# Patient Record
Sex: Female | Born: 1980 | Race: White | Hispanic: No | Smoking: Never smoker
Health system: Southern US, Community
[De-identification: ages and names within clinical notes are randomized; demographics above are authoritative.]

## PROBLEM LIST (undated history)

## (undated) DIAGNOSIS — I10 Essential (primary) hypertension: Secondary | ICD-10-CM

## (undated) DIAGNOSIS — E7800 Pure hypercholesterolemia, unspecified: Secondary | ICD-10-CM

## (undated) DIAGNOSIS — G43909 Migraine, unspecified, not intractable, without status migrainosus: Secondary | ICD-10-CM

## (undated) DIAGNOSIS — N83209 Unspecified ovarian cyst, unspecified side: Secondary | ICD-10-CM

## (undated) DIAGNOSIS — D649 Anemia, unspecified: Secondary | ICD-10-CM

## (undated) DIAGNOSIS — J45909 Unspecified asthma, uncomplicated: Secondary | ICD-10-CM

## (undated) DIAGNOSIS — J40 Bronchitis, not specified as acute or chronic: Secondary | ICD-10-CM

## (undated) HISTORY — PX: TUBAL LIGATION: SHX77

## (undated) HISTORY — PX: CHOLECYSTECTOMY: SHX55

## (undated) HISTORY — PX: TONSILLECTOMY: SUR1361

## (undated) HISTORY — PX: KNEE SURGERY: SHX244

---

## 1998-03-06 ENCOUNTER — Observation Stay (HOSPITAL_COMMUNITY): Admission: AD | Admit: 1998-03-06 | Discharge: 1998-03-06 | Payer: Self-pay | Admitting: Obstetrics

## 1998-03-10 ENCOUNTER — Inpatient Hospital Stay (HOSPITAL_COMMUNITY): Admission: AD | Admit: 1998-03-10 | Discharge: 1998-03-10 | Payer: Self-pay | Admitting: Obstetrics

## 1998-03-15 ENCOUNTER — Inpatient Hospital Stay (HOSPITAL_COMMUNITY): Admission: AD | Admit: 1998-03-15 | Discharge: 1998-03-15 | Payer: Self-pay | Admitting: *Deleted

## 1998-03-22 ENCOUNTER — Inpatient Hospital Stay (HOSPITAL_COMMUNITY): Admission: AD | Admit: 1998-03-22 | Discharge: 1998-03-22 | Payer: Self-pay | Admitting: Obstetrics & Gynecology

## 1998-03-25 ENCOUNTER — Inpatient Hospital Stay (HOSPITAL_COMMUNITY): Admission: AD | Admit: 1998-03-25 | Discharge: 1998-03-25 | Payer: Self-pay | Admitting: Obstetrics & Gynecology

## 1998-03-25 ENCOUNTER — Emergency Department (HOSPITAL_COMMUNITY): Admission: EM | Admit: 1998-03-25 | Discharge: 1998-03-25 | Payer: Self-pay | Admitting: Emergency Medicine

## 1998-03-25 ENCOUNTER — Encounter: Payer: Self-pay | Admitting: Emergency Medicine

## 1998-03-31 ENCOUNTER — Inpatient Hospital Stay (HOSPITAL_COMMUNITY): Admission: AD | Admit: 1998-03-31 | Discharge: 1998-03-31 | Payer: Self-pay | Admitting: Obstetrics

## 1998-04-03 ENCOUNTER — Inpatient Hospital Stay (HOSPITAL_COMMUNITY): Admission: AD | Admit: 1998-04-03 | Discharge: 1998-04-03 | Payer: Self-pay | Admitting: *Deleted

## 1998-04-17 ENCOUNTER — Observation Stay (HOSPITAL_COMMUNITY): Admission: AD | Admit: 1998-04-17 | Discharge: 1998-04-17 | Payer: Self-pay | Admitting: *Deleted

## 1998-04-17 ENCOUNTER — Encounter: Payer: Self-pay | Admitting: *Deleted

## 1998-04-18 ENCOUNTER — Observation Stay (HOSPITAL_COMMUNITY): Admission: AD | Admit: 1998-04-18 | Discharge: 1998-04-18 | Payer: Self-pay | Admitting: *Deleted

## 1998-05-07 ENCOUNTER — Inpatient Hospital Stay (HOSPITAL_COMMUNITY): Admission: AD | Admit: 1998-05-07 | Discharge: 1998-05-07 | Payer: Self-pay | Admitting: *Deleted

## 1998-05-11 ENCOUNTER — Inpatient Hospital Stay (HOSPITAL_COMMUNITY): Admission: AD | Admit: 1998-05-11 | Discharge: 1998-05-11 | Payer: Self-pay | Admitting: Obstetrics

## 2000-11-19 ENCOUNTER — Emergency Department (HOSPITAL_COMMUNITY): Admission: EM | Admit: 2000-11-19 | Discharge: 2000-11-20 | Payer: Self-pay | Admitting: Emergency Medicine

## 2000-11-20 ENCOUNTER — Encounter: Payer: Self-pay | Admitting: Emergency Medicine

## 2000-12-10 ENCOUNTER — Emergency Department (HOSPITAL_COMMUNITY): Admission: EM | Admit: 2000-12-10 | Discharge: 2000-12-10 | Payer: Self-pay | Admitting: Emergency Medicine

## 2001-07-18 ENCOUNTER — Emergency Department (HOSPITAL_COMMUNITY): Admission: EM | Admit: 2001-07-18 | Discharge: 2001-07-18 | Payer: Self-pay | Admitting: Emergency Medicine

## 2001-07-18 ENCOUNTER — Encounter: Payer: Self-pay | Admitting: Emergency Medicine

## 2001-08-13 ENCOUNTER — Encounter: Payer: Self-pay | Admitting: Emergency Medicine

## 2001-08-13 ENCOUNTER — Emergency Department (HOSPITAL_COMMUNITY): Admission: EM | Admit: 2001-08-13 | Discharge: 2001-08-13 | Payer: Self-pay | Admitting: Emergency Medicine

## 2001-10-12 ENCOUNTER — Emergency Department (HOSPITAL_COMMUNITY): Admission: EM | Admit: 2001-10-12 | Discharge: 2001-10-12 | Payer: Self-pay | Admitting: Emergency Medicine

## 2001-11-17 ENCOUNTER — Encounter: Payer: Self-pay | Admitting: Emergency Medicine

## 2001-11-17 ENCOUNTER — Emergency Department (HOSPITAL_COMMUNITY): Admission: EM | Admit: 2001-11-17 | Discharge: 2001-11-17 | Payer: Self-pay | Admitting: Emergency Medicine

## 2001-12-31 ENCOUNTER — Emergency Department (HOSPITAL_COMMUNITY): Admission: EM | Admit: 2001-12-31 | Discharge: 2001-12-31 | Payer: Self-pay | Admitting: Emergency Medicine

## 2002-02-26 ENCOUNTER — Inpatient Hospital Stay (HOSPITAL_COMMUNITY): Admission: AC | Admit: 2002-02-26 | Discharge: 2002-03-03 | Payer: Self-pay

## 2002-02-26 ENCOUNTER — Encounter: Payer: Self-pay | Admitting: General Surgery

## 2002-02-26 ENCOUNTER — Encounter: Payer: Self-pay | Admitting: *Deleted

## 2002-02-27 ENCOUNTER — Encounter: Payer: Self-pay | Admitting: General Surgery

## 2002-03-19 ENCOUNTER — Emergency Department (HOSPITAL_COMMUNITY): Admission: EM | Admit: 2002-03-19 | Discharge: 2002-03-19 | Payer: Self-pay | Admitting: Emergency Medicine

## 2002-05-12 ENCOUNTER — Encounter: Admission: RE | Admit: 2002-05-12 | Discharge: 2002-08-10 | Payer: Self-pay | Admitting: Orthopedic Surgery

## 2008-05-06 ENCOUNTER — Emergency Department: Payer: Self-pay | Admitting: Internal Medicine

## 2012-06-06 ENCOUNTER — Emergency Department: Payer: Self-pay | Admitting: Emergency Medicine

## 2012-06-06 LAB — COMPREHENSIVE METABOLIC PANEL
Anion Gap: 8 (ref 7–16)
BUN: 9 mg/dL (ref 7–18)
Bilirubin,Total: 1 mg/dL (ref 0.2–1.0)
Co2: 27 mmol/L (ref 21–32)
Creatinine: 0.79 mg/dL (ref 0.60–1.30)
EGFR (African American): >60
Glucose: 96 mg/dL (ref 65–99)
Potassium: 3.4 mmol/L — ABNORMAL LOW (ref 3.5–5.1)
SGOT(AST): 15 U/L (ref 15–37)
SGPT (ALT): 18 U/L (ref 12–78)
Sodium: 142 mmol/L (ref 136–145)
Total Protein: 7.6 g/dL (ref 6.4–8.2)

## 2012-06-06 LAB — CBC
HGB: 14.8 g/dL (ref 12.0–16.0)
MCHC: 35 g/dL (ref 32.0–36.0)
MCV: 90 fL (ref 80–100)
RBC: 4.72 10*6/uL (ref 3.80–5.20)

## 2012-06-06 LAB — URINALYSIS, COMPLETE
Bilirubin,UR: NEGATIVE
Glucose,UR: NEGATIVE mg/dL (ref 0–75)
Leukocyte Esterase: NEGATIVE
Nitrite: NEGATIVE
RBC,UR: 2 /HPF (ref 0–5)
Specific Gravity: 1.011 (ref 1.003–1.030)
Squamous Epithelial: 15
WBC UR: 2 /HPF (ref 0–5)

## 2012-06-07 LAB — WET PREP, GENITAL

## 2012-08-14 ENCOUNTER — Emergency Department (HOSPITAL_COMMUNITY): Payer: No Typology Code available for payment source

## 2012-08-14 ENCOUNTER — Emergency Department (HOSPITAL_COMMUNITY)
Admission: EM | Admit: 2012-08-14 | Discharge: 2012-08-14 | Disposition: A | Discharge status: Home / Self Care | Payer: No Typology Code available for payment source | Attending: Emergency Medicine | Admitting: Emergency Medicine

## 2012-08-14 ENCOUNTER — Encounter (HOSPITAL_COMMUNITY): Payer: Self-pay | Admitting: Emergency Medicine

## 2012-08-14 DIAGNOSIS — Y939 Activity, unspecified: Secondary | ICD-10-CM | POA: Insufficient documentation

## 2012-08-14 DIAGNOSIS — T148XXA Other injury of unspecified body region, initial encounter: Secondary | ICD-10-CM

## 2012-08-14 DIAGNOSIS — Z8679 Personal history of other diseases of the circulatory system: Secondary | ICD-10-CM | POA: Insufficient documentation

## 2012-08-14 DIAGNOSIS — I10 Essential (primary) hypertension: Secondary | ICD-10-CM | POA: Insufficient documentation

## 2012-08-14 DIAGNOSIS — IMO0002 Reserved for concepts with insufficient information to code with codable children: Secondary | ICD-10-CM | POA: Insufficient documentation

## 2012-08-14 DIAGNOSIS — R209 Unspecified disturbances of skin sensation: Secondary | ICD-10-CM | POA: Insufficient documentation

## 2012-08-14 DIAGNOSIS — Z79899 Other long term (current) drug therapy: Secondary | ICD-10-CM | POA: Insufficient documentation

## 2012-08-14 DIAGNOSIS — S161XXA Strain of muscle, fascia and tendon at neck level, initial encounter: Secondary | ICD-10-CM

## 2012-08-14 DIAGNOSIS — Y92410 Unspecified street and highway as the place of occurrence of the external cause: Secondary | ICD-10-CM | POA: Insufficient documentation

## 2012-08-14 DIAGNOSIS — S139XXA Sprain of joints and ligaments of unspecified parts of neck, initial encounter: Secondary | ICD-10-CM | POA: Insufficient documentation

## 2012-08-14 DIAGNOSIS — S9000XA Contusion of unspecified ankle, initial encounter: Secondary | ICD-10-CM | POA: Insufficient documentation

## 2012-08-14 DIAGNOSIS — S0990XA Unspecified injury of head, initial encounter: Secondary | ICD-10-CM | POA: Insufficient documentation

## 2012-08-14 DIAGNOSIS — R110 Nausea: Secondary | ICD-10-CM | POA: Insufficient documentation

## 2012-08-14 HISTORY — DX: Migraine, unspecified, not intractable, without status migrainosus: G43.909

## 2012-08-14 HISTORY — DX: Essential (primary) hypertension: I10

## 2012-08-14 MED ORDER — IBUPROFEN 200 MG PO TABS
600.0000 mg | ORAL_TABLET | Freq: Once | ORAL | Status: AC
  Administered 2012-08-14: 600 mg via ORAL
  Filled 2012-08-14: qty 3

## 2012-08-14 MED ORDER — IBUPROFEN 600 MG PO TABS: 600.0000 mg | ORAL_TABLET | Freq: Four times a day (QID) | ORAL | Status: DC | PRN

## 2012-08-14 MED ORDER — METHOCARBAMOL 500 MG PO TABS: 1000.0000 mg | ORAL_TABLET | Freq: Four times a day (QID) | ORAL | Status: DC

## 2012-08-14 NOTE — ED Notes (Signed)
MD at bedside. 
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
## 2012-08-14 NOTE — ED Provider Notes (Signed)
Medical screening examination/treatment/procedure(s) were performed by non-physician practitioner and as supervising physician I was immediately available for consultation/collaboration. Tanecia Mccay, MD, FACEP   Tsugio Elison L Karem Farha, MD 08/14/12 2020 
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
## 2012-10-01 ENCOUNTER — Encounter (HOSPITAL_COMMUNITY): Payer: Self-pay | Admitting: Emergency Medicine

## 2012-10-01 DIAGNOSIS — I10 Essential (primary) hypertension: Secondary | ICD-10-CM | POA: Insufficient documentation

## 2012-10-01 DIAGNOSIS — J45909 Unspecified asthma, uncomplicated: Secondary | ICD-10-CM | POA: Insufficient documentation

## 2012-10-01 DIAGNOSIS — Z8679 Personal history of other diseases of the circulatory system: Secondary | ICD-10-CM | POA: Insufficient documentation

## 2012-10-01 DIAGNOSIS — Z79899 Other long term (current) drug therapy: Secondary | ICD-10-CM | POA: Insufficient documentation

## 2012-10-01 DIAGNOSIS — Z8742 Personal history of other diseases of the female genital tract: Secondary | ICD-10-CM | POA: Insufficient documentation

## 2012-10-01 DIAGNOSIS — Z3202 Encounter for pregnancy test, result negative: Secondary | ICD-10-CM | POA: Insufficient documentation

## 2012-10-01 DIAGNOSIS — Z862 Personal history of diseases of the blood and blood-forming organs and certain disorders involving the immune mechanism: Secondary | ICD-10-CM | POA: Insufficient documentation

## 2012-10-01 DIAGNOSIS — R1032 Left lower quadrant pain: Secondary | ICD-10-CM | POA: Insufficient documentation

## 2012-10-01 LAB — CBC WITH DIFFERENTIAL/PLATELET
Eosinophils Relative: 1 % (ref 0–5)
HCT: 39.3 % (ref 36.0–46.0)
Hemoglobin: 14.1 g/dL (ref 12.0–15.0)
Lymphocytes Relative: 30 % (ref 12–46)
MCV: 85.6 fL (ref 78.0–100.0)
Monocytes Absolute: 0.4 10*3/uL (ref 0.1–1.0)
Monocytes Relative: 6 % (ref 3–12)
Neutro Abs: 4.5 10*3/uL (ref 1.7–7.7)
WBC: 7.1 10*3/uL (ref 4.0–10.5)

## 2012-10-01 MED ORDER — ONDANSETRON HCL 4 MG/2ML IJ SOLN
4.0000 mg | Freq: Once | INTRAMUSCULAR | Status: AC
  Administered 2012-10-02: 4 mg via INTRAVENOUS
  Filled 2012-10-01: qty 2

## 2012-10-01 MED ORDER — SODIUM CHLORIDE 0.9 % IV SOLN: 1000.0000 mL | Freq: Once | INTRAVENOUS | Status: DC

## 2012-10-01 NOTE — ED Notes (Signed)
PT. REPORTS LLQ PAIN WITH NAUSEA ONSET THIS EVENING , DENIES EMESIS OR DIARRHE A, NO DYSURIA OR VAGINAL DISCHARGE , STATES HISTORY OF OVARIAN CYST.

## 2012-10-01 NOTE — ED Notes (Signed)
BIB [facility]. Left lower abd pain, started 2149. N/v but no diarrhea. Recent diagnosed at [facility] with STD. Patient believes she has an ovarian cyst (past diagnosis with same) NO CP, SOB. Hx HTN 164/100 80hr 98ra. Pain 10/10 sharp.

## 2012-10-02 ENCOUNTER — Emergency Department (HOSPITAL_COMMUNITY): Payer: Medicaid Other

## 2012-10-02 ENCOUNTER — Emergency Department (HOSPITAL_COMMUNITY)
Admission: EM | Admit: 2012-10-02 | Discharge: 2012-10-02 | Disposition: A | Discharge status: Home / Self Care | Payer: Medicaid Other | Attending: Emergency Medicine | Admitting: Emergency Medicine

## 2012-10-02 DIAGNOSIS — R1032 Left lower quadrant pain: Secondary | ICD-10-CM

## 2012-10-02 HISTORY — DX: Unspecified ovarian cyst, unspecified side: N83.209

## 2012-10-02 HISTORY — DX: Anemia, unspecified: D64.9

## 2012-10-02 HISTORY — DX: Unspecified asthma, uncomplicated: J45.909

## 2012-10-02 LAB — URINALYSIS, ROUTINE W REFLEX MICROSCOPIC
Glucose, UA: NEGATIVE mg/dL
Ketones, ur: NEGATIVE mg/dL
Leukocytes, UA: NEGATIVE
Protein, ur: NEGATIVE mg/dL
Urobilinogen, UA: 1 mg/dL (ref 0.0–1.0)

## 2012-10-02 LAB — COMPREHENSIVE METABOLIC PANEL
BUN: 6 mg/dL (ref 6–23)
CO2: 25 mEq/L (ref 19–32)
Calcium: 9.2 mg/dL (ref 8.4–10.5)
Chloride: 103 mEq/L (ref 96–112)
Creatinine, Ser: 0.72 mg/dL (ref 0.50–1.10)
GFR calc Af Amer: >90 mL/min (ref >90)
GFR calc non Af Amer: >90 mL/min (ref >90)
Glucose, Bld: 106 mg/dL — ABNORMAL HIGH (ref 70–99)
Total Bilirubin: 1.3 mg/dL — ABNORMAL HIGH (ref 0.3–1.2)

## 2012-10-02 LAB — LIPASE, BLOOD: Lipase: 37 U/L (ref 11–59)

## 2012-10-02 MED ORDER — ONDANSETRON HCL 4 MG/2ML IJ SOLN
4.0000 mg | Freq: Once | INTRAMUSCULAR | Status: AC
  Administered 2012-10-02: 4 mg via INTRAVENOUS
  Filled 2012-10-02: qty 2

## 2012-10-02 MED ORDER — KETOROLAC TROMETHAMINE 30 MG/ML IJ SOLN
30.0000 mg | Freq: Once | INTRAMUSCULAR | Status: AC
  Administered 2012-10-02: 30 mg via INTRAVENOUS
  Filled 2012-10-02: qty 1

## 2012-10-02 MED ORDER — POTASSIUM CHLORIDE CRYS ER 20 MEQ PO TBCR
40.0000 meq | EXTENDED_RELEASE_TABLET | Freq: Once | ORAL | Status: AC
  Administered 2012-10-02: 40 meq via ORAL
  Filled 2012-10-02: qty 2

## 2012-10-02 MED ORDER — IBUPROFEN 800 MG PO TABS: 800.0000 mg | ORAL_TABLET | Freq: Three times a day (TID) | ORAL | Status: DC

## 2012-10-02 MED ORDER — HYDROMORPHONE HCL PF 1 MG/ML IJ SOLN
1.0000 mg | Freq: Once | INTRAMUSCULAR | Status: AC
  Administered 2012-10-02: 1 mg via INTRAVENOUS
  Filled 2012-10-02: qty 1

## 2012-10-02 NOTE — ED Provider Notes (Signed)
History     CSN: [id number]  Arrival date & time 10/01/12  2244   First MD Initiated Contact with Patient 10/02/12 0158      Chief Complaint  Patient presents with  . Abdominal Pain    (Consider location/radiation/quality/duration/timing/severity/associated sxs/prior treatment) HPI Hx per patient. LLQ ABD pain for the last few weeks, was diagnosed with ovarian cyst. More recently was treated for STD and now tonight having recurrent pain. [facility] in [place], no radiation, no dysuria, no vag discharge or bleeding, no F/C. She had some N/V earlier now resolved no diarrhea. Mod in severity.   Past Medical History  Diagnosis Date  . Hypertension   . Migraine   . Asthma   . Anemia   . Migraine   . Ovarian cyst     Past Surgical History  Procedure Laterality Date  . Cesarean section    . Cholecystectomy    . Tonsillectomy    . Tubal ligation      No family history on file.  History  Substance Use Topics  . Smoking status: Never Smoker   . Smokeless tobacco: Not on file  . Alcohol Use: No    OB History   Grav Para Term Preterm Abortions TAB SAB Ect Mult Living                  Review of Systems  Constitutional: Negative for fever and chills.  HENT: Negative for neck pain and neck stiffness.   Eyes: Negative for pain.  Respiratory: Negative for shortness of breath.   Cardiovascular: Negative for chest pain.  Gastrointestinal: Positive for abdominal pain. Negative for constipation and blood in stool.  Genitourinary: Negative for dysuria.  Musculoskeletal: Negative for back pain.  Skin: Negative for rash.  Neurological: Negative for headaches.  All other systems reviewed and are negative.    Allergies  Codeine  Home Medications   Current Outpatient Rx  Name  Route  Sig  Dispense  Refill  . albuterol (PROVENTIL HFA;VENTOLIN HFA) 108 (90 BASE) MCG/ACT inhaler   Inhalation   Inhale 2 puffs into the lungs every 6 (six) hours as needed (shortnes of breath/  coughing).         . diphenhydramine-acetaminophen (TYLENOL PM) 25-500 MG TABS   Oral   Take 1 tablet by mouth every 6 (six) hours as needed.         . medroxyPROGESTERone (DEPO-PROVERA) 150 MG/ML injection   Intramuscular   Inject 150 mg into the muscle every 3 (three) months.           BP 153/101  Pulse 96  Temp(Src) 98.7 F (37.1 C) (Oral)  Resp 14  SpO2 100%  LMP 09/22/2012  Physical Exam  Constitutional: She is oriented to person, place, and time. She appears well-developed and well-nourished.  HENT:  Head: Normocephalic and atraumatic.  Mouth/Throat: Oropharynx is clear and moist. No oropharyngeal exudate.  Eyes: EOM are normal. Pupils are equal, round, and reactive to light. No scleral icterus.  Neck: Neck supple.  Cardiovascular: Normal rate, regular rhythm and intact distal pulses.   Pulmonary/Chest: Effort normal and breath sounds normal. No respiratory distress.  Abdominal: Soft. Bowel sounds are normal. She exhibits no distension. There is no rebound and no guarding.  LLQ TTP, no acute ABD  Musculoskeletal: Normal range of motion. She exhibits no edema.  Neurological: She is alert and oriented to person, place, and time.  Skin: Skin is warm and dry.    ED Course  Procedures (  including critical care time)  Labs Reviewed  COMPREHENSIVE METABOLIC PANEL - Abnormal; Notable for the following:    Potassium 3.1 (*)    Glucose, Bld 106 (*)    Total Bilirubin 1.3 (*)    All other components within normal limits  CBC WITH DIFFERENTIAL  LIPASE, BLOOD  URINALYSIS, ROUTINE W REFLEX MICROSCOPIC  POCT PREGNANCY, URINE   US Transvaginal Non-ob  10/02/2012   *RADIOLOGY REPORT*  Clinical Data: Left lower quadrant pain  TRANSABDOMINAL AND TRANSVAGINAL ULTRASOUND OF PELVIS Technique:  Both transabdominal and transvaginal ultrasound examinations of the pelvis were performed. Transabdominal technique was performed for global imaging of the pelvis including uterus,  ovaries, adnexal regions, and pelvic cul-de-sac.  It was necessary to proceed with endovaginal exam following the transabdominal exam to visualize the endometrium and adnexa.  Comparison:  None  Findings:  Uterus: A C-section scar noted.  Otherwise, normal sonographic appearance, measuring 8.3 x 3.7 x 5.1 cm.  Endometrium: Normal in thickness at 8 mm.  There is a small amount of fluid within the endometrial canal, nonspecific.  Right ovary:  Normal sonographic appearance, measuring 4.0 x 1.9 x 2.8 cm.  Left ovary: Normal sonographic appearance, measuring 2.9 x 1.8 x 2.2 cm.  Other findings: No free fluid.  Arterial and venous wave forms and color Doppler flow documented to the the ovaries bilaterally.  IMPRESSION: Normal sonographic appearance to the ovaries with color Doppler flow and arterial and venous wave forms documented bilaterally.   Original Report Authenticated By: Jearld Lesch, M.D.   US Pelvis Complete  10/02/2012   *RADIOLOGY REPORT*  Clinical Data: Left lower quadrant pain  TRANSABDOMINAL AND TRANSVAGINAL ULTRASOUND OF PELVIS Technique:  Both transabdominal and transvaginal ultrasound examinations of the pelvis were performed. Transabdominal technique was performed for global imaging of the pelvis including uterus, ovaries, adnexal regions, and pelvic cul-de-sac.  It was necessary to proceed with endovaginal exam following the transabdominal exam to visualize the endometrium and adnexa.  Comparison:  None  Findings:  Uterus: A C-section scar noted.  Otherwise, normal sonographic appearance, measuring 8.3 x 3.7 x 5.1 cm.  Endometrium: Normal in thickness at 8 mm.  There is a small amount of fluid within the endometrial canal, nonspecific.  Right ovary:  Normal sonographic appearance, measuring 4.0 x 1.9 x 2.8 cm.  Left ovary: Normal sonographic appearance, measuring 2.9 x 1.8 x 2.2 cm.  Other findings: No free fluid.  Arterial and venous wave forms and color Doppler flow documented to the the  ovaries bilaterally.  IMPRESSION: Normal sonographic appearance to the ovaries with color Doppler flow and arterial and venous wave forms documented bilaterally.   Original Report Authenticated By: Jearld Lesch, M.D.   [place] Art/ven Flow Abd Pelv Doppler  10/02/2012   *RADIOLOGY REPORT*  Clinical Data: Left lower quadrant pain  TRANSABDOMINAL AND TRANSVAGINAL ULTRASOUND OF PELVIS Technique:  Both transabdominal and transvaginal ultrasound examinations of the pelvis were performed. Transabdominal technique was performed for global imaging of the pelvis including uterus, ovaries, adnexal regions, and pelvic cul-de-sac.  It was necessary to proceed with endovaginal exam following the transabdominal exam to visualize the endometrium and adnexa.  Comparison:  None  Findings:  Uterus: A C-section scar noted.  Otherwise, normal sonographic appearance, measuring 8.3 x 3.7 x 5.1 cm.  Endometrium: Normal in thickness at 8 mm.  There is a small amount of fluid within the endometrial canal, nonspecific.  Right ovary:  Normal sonographic appearance, measuring 4.0 x 1.9 x 2.8 cm.  Left ovary: Normal sonographic appearance, measuring 2.9 x 1.8 x 2.2 cm.  Other findings: No free fluid.  Arterial and venous wave forms and color Doppler flow documented to the the ovaries bilaterally.  IMPRESSION: Normal sonographic appearance to the ovaries with color Doppler flow and arterial and venous wave forms documented bilaterally.   Original Report Authenticated By: Jearld Lesch, M.D.    IV Dilaudi/ zofran Potassium for hypokalemia - tolerates POs 4:31 AM pain improved, [place] results shared with PT, plan d/c home outpatient follow up, Rx NSAIDs provided. Return precautions verbalized as understood.  MDM  LLQ ABD pain  Evaluated with [place], labs reviewed as above Treated with IV narcotics VS and nursing notes reviewed and considered        Sunnie Nielsen, MD 10/02/12 [phone]

## 2012-12-08 ENCOUNTER — Encounter (HOSPITAL_COMMUNITY): Payer: Self-pay | Admitting: *Deleted

## 2012-12-08 ENCOUNTER — Emergency Department (HOSPITAL_COMMUNITY)
Admission: EM | Admit: 2012-12-08 | Discharge: 2012-12-08 | Disposition: A | Discharge status: Home / Self Care | Payer: Medicaid Other | Attending: Emergency Medicine | Admitting: Emergency Medicine

## 2012-12-08 DIAGNOSIS — Z8742 Personal history of other diseases of the female genital tract: Secondary | ICD-10-CM | POA: Insufficient documentation

## 2012-12-08 DIAGNOSIS — Z8709 Personal history of other diseases of the respiratory system: Secondary | ICD-10-CM | POA: Insufficient documentation

## 2012-12-08 DIAGNOSIS — Z8639 Personal history of other endocrine, nutritional and metabolic disease: Secondary | ICD-10-CM | POA: Insufficient documentation

## 2012-12-08 DIAGNOSIS — J3489 Other specified disorders of nose and nasal sinuses: Secondary | ICD-10-CM | POA: Insufficient documentation

## 2012-12-08 DIAGNOSIS — R112 Nausea with vomiting, unspecified: Secondary | ICD-10-CM | POA: Insufficient documentation

## 2012-12-08 DIAGNOSIS — Z862 Personal history of diseases of the blood and blood-forming organs and certain disorders involving the immune mechanism: Secondary | ICD-10-CM | POA: Insufficient documentation

## 2012-12-08 DIAGNOSIS — I10 Essential (primary) hypertension: Secondary | ICD-10-CM | POA: Insufficient documentation

## 2012-12-08 DIAGNOSIS — G43909 Migraine, unspecified, not intractable, without status migrainosus: Secondary | ICD-10-CM | POA: Insufficient documentation

## 2012-12-08 DIAGNOSIS — J45909 Unspecified asthma, uncomplicated: Secondary | ICD-10-CM | POA: Insufficient documentation

## 2012-12-08 HISTORY — DX: Pure hypercholesterolemia, unspecified: E78.00

## 2012-12-08 HISTORY — DX: Bronchitis, not specified as acute or chronic: J40

## 2012-12-08 MED ORDER — ONDANSETRON HCL 4 MG/2ML IJ SOLN
4.0000 mg | Freq: Once | INTRAMUSCULAR | Status: AC
  Administered 2012-12-08: 4 mg via INTRAVENOUS
  Filled 2012-12-08: qty 2

## 2012-12-08 MED ORDER — KETOROLAC TROMETHAMINE 30 MG/ML IJ SOLN
30.0000 mg | Freq: Once | INTRAMUSCULAR | Status: AC
  Administered 2012-12-08: 30 mg via INTRAVENOUS
  Filled 2012-12-08: qty 1

## 2012-12-08 MED ORDER — NAPROXEN 500 MG PO TABS: 500.0000 mg | ORAL_TABLET | Freq: Two times a day (BID) | ORAL | Status: DC

## 2012-12-08 MED ORDER — AZITHROMYCIN 250 MG PO TABS
500.0000 mg | ORAL_TABLET | Freq: Once | ORAL | Status: AC
  Administered 2012-12-08: 500 mg via ORAL
  Filled 2012-12-08: qty 2

## 2012-12-08 MED ORDER — AZITHROMYCIN 250 MG PO TABS: 250.0000 mg | ORAL_TABLET | Freq: Every day | ORAL | Status: DC

## 2012-12-08 NOTE — ED Provider Notes (Signed)
History    This chart was scribed for Nicole Roller, MD, by Yevette Edwards, ED Scribe. This patient was seen in room APA09/APA09 and the patient's care was started at 12:58 PM.  CSN: [id number] Arrival date & time 12/08/12  1127  First MD Initiated Contact with Patient 12/08/12 1256     Chief Complaint  Patient presents with  . Migraine  . Asthma    The history is provided by the patient. No language interpreter was used.   HPI Comments: Nicole Combs is a 32 y.o. female, with a h/o of migraines, who presents to the Emergency Department complaining of  waxing and waning migraines which she has had chronically for a year.  She reports experiencing her first migraine 4 years ago after the birth of her youngest child. The pt states that her prescribed medication lessens the migraine a little, but the migraine returns every morning. She reports that she has also been experiencing increased episodes of asthma, sinus pressure, sneezing productive of yellow phlegm, nausea and emesis.  The pt denies experiencing any dizziness. She has a h/o of HTN. She denies smoking and she denies alcohol usage.    Past Medical History  Diagnosis Date  . Hypertension   . Migraine   . Asthma   . Anemia   . Migraine   . Ovarian cyst   . High cholesterol   . Bronchitis    Past Surgical History  Procedure Laterality Date  . Cesarean section    . Cholecystectomy    . Tonsillectomy    . Tubal ligation     No family history on file. History  Substance Use Topics  . Smoking status: Never Smoker   . Smokeless tobacco: Not on file  . Alcohol Use: No   No OB history provided.   Review of Systems A complete 10 system review of systems was obtained, and all systems were negative except where indicated in the HPI and PE.   Allergies  Codeine  Home Medications   Current Outpatient Rx  Name  Route  Sig  Dispense  Refill  . albuterol (PROVENTIL HFA;VENTOLIN HFA) 108 (90 BASE) MCG/ACT inhaler  Inhalation   Inhale 2 puffs into the lungs every 6 (six) hours as needed (shortnes of breath/ coughing).         . diphenhydramine-acetaminophen (TYLENOL PM) 25-500 MG TABS   Oral   Take 1 tablet by mouth every 6 (six) hours as needed.         . SUMAtriptan (IMITREX) 50 MG tablet   Oral   Take 50 mg by mouth every 2 (two) hours as needed for migraine.         Marland Kitchen azithromycin (ZITHROMAX Z-PAK) 250 MG tablet   Oral   Take 1 tablet (250 mg total) by mouth daily. 500mg  PO day 1, then 250mg  PO days 205   6 tablet   0   . medroxyPROGESTERone (DEPO-PROVERA) 150 MG/ML injection   Intramuscular   Inject 150 mg into the muscle every 3 (three) months.         . naproxen (NAPROSYN) 500 MG tablet   Oral   Take 1 tablet (500 mg total) by mouth 2 (two) times daily with a meal.   30 tablet   0    Triage Vitals: BP 128/93  Pulse 99  Temp(Src) 98.1 F (36.7 C) (Oral)  Resp 20  SpO2 99%  Physical Exam  Nursing note and vitals reviewed. Constitutional: She appears well-developed  and well-nourished. No distress.  HENT:  Head: Normocephalic and atraumatic.  Mouth/Throat: Oropharynx is clear and moist. No oropharyngeal exudate.  Eyes: Conjunctivae and EOM are normal. Pupils are equal, round, and reactive to light. Right eye exhibits no discharge. Left eye exhibits no discharge. No scleral icterus.  Neck: Normal range of motion. Neck supple. No JVD present. No thyromegaly present.  Cardiovascular: Normal rate, regular rhythm, normal heart sounds and intact distal pulses.  Exam reveals no gallop and no friction rub.   No murmur heard. Pulmonary/Chest: Effort normal and breath sounds normal. No respiratory distress. She has no wheezes. She has no rales.  Abdominal: Soft. Bowel sounds are normal. She exhibits no distension and no mass. There is no tenderness.  Musculoskeletal: Normal range of motion. She exhibits no edema and no tenderness.  Lymphadenopathy:    She has no cervical  adenopathy.  Neurological: She is alert. Coordination normal.  Skin: Skin is warm and dry. No rash noted. No erythema.  Psychiatric: She has a normal mood and affect. Her behavior is normal.    ED Course  Procedures (including critical care time)  DIAGNOSTIC STUDIES: Oxygen Saturation is 99% on room air, normal by my interpretation.    COORDINATION OF CARE:  1:02 PM-Discussed treatment plan with patient which includes pain medication, and the patient agreed to the plan.    Labs Reviewed - No data to display No results found. 1. Migraine     MDM  Overall the patient appears well, she has no focal neurologic deficits, she does have symptoms of sinusitis with the purulent drainage from her nose however on exam she has no clinical severe sinusitis. She has been treated with several of her medications and IV fluids and has improved significantly, she states on repeat evaluation and she feels much better and is ready for discharge. I will recommend followup with the neurologist, I have given her family doctor list as well.    Meds given in ED:  Medications  ketorolac (TORADOL) 30 MG/ML injection 30 mg (30 mg Intravenous Given 12/08/12 1324)  ondansetron (ZOFRAN) injection 4 mg (4 mg Intravenous Given 12/08/12 1321)  azithromycin (ZITHROMAX) tablet 500 mg (500 mg Oral Given 12/08/12 1324)    New Prescriptions   AZITHROMYCIN (ZITHROMAX Z-PAK) 250 MG TABLET    Take 1 tablet (250 mg total) by mouth daily. 500mg  PO day 1, then 250mg  PO days 205   NAPROXEN (NAPROSYN) 500 MG TABLET    Take 1 tablet (500 mg total) by mouth 2 (two) times daily with a meal.      I personally performed the services described in this documentation, which was scribed in my presence. The recorded information has been reviewed and is accurate.      Nicole Roller, MD 12/08/12 [phone]

## 2012-12-08 NOTE — ED Notes (Signed)
Migraine headache since last night with nausea.  Has went without Topamax "for a while" and recently gotten refilled but only received 6 tabs.  No relief from Topamax.  Denies vomiting/dizziness.   Also reporting wheezing since last week with no relief from inhaler.

## 2012-12-08 NOTE — ED Notes (Signed)
Pt c/o migraine headache since late last night as well as N/V. Pt has hx of migraines. Pt states her regular meds are not helping. Pt describes headache as "pounding". Pt also has a hx of asthma and reports "some wheezing and congestion" since this morning.

## 2013-01-29 ENCOUNTER — Emergency Department (HOSPITAL_COMMUNITY)
Admission: EM | Admit: 2013-01-29 | Discharge: 2013-01-29 | Disposition: A | Discharge status: Home / Self Care | Payer: Medicaid Other | Attending: Emergency Medicine | Admitting: Emergency Medicine

## 2013-01-29 ENCOUNTER — Encounter (HOSPITAL_COMMUNITY): Payer: Self-pay

## 2013-01-29 DIAGNOSIS — J45909 Unspecified asthma, uncomplicated: Secondary | ICD-10-CM | POA: Insufficient documentation

## 2013-01-29 DIAGNOSIS — Z8639 Personal history of other endocrine, nutritional and metabolic disease: Secondary | ICD-10-CM | POA: Insufficient documentation

## 2013-01-29 DIAGNOSIS — N949 Unspecified condition associated with female genital organs and menstrual cycle: Secondary | ICD-10-CM | POA: Insufficient documentation

## 2013-01-29 DIAGNOSIS — R102 Pelvic and perineal pain: Secondary | ICD-10-CM

## 2013-01-29 DIAGNOSIS — Z8742 Personal history of other diseases of the female genital tract: Secondary | ICD-10-CM | POA: Insufficient documentation

## 2013-01-29 DIAGNOSIS — Z862 Personal history of diseases of the blood and blood-forming organs and certain disorders involving the immune mechanism: Secondary | ICD-10-CM | POA: Insufficient documentation

## 2013-01-29 DIAGNOSIS — Z9889 Other specified postprocedural states: Secondary | ICD-10-CM | POA: Insufficient documentation

## 2013-01-29 DIAGNOSIS — I10 Essential (primary) hypertension: Secondary | ICD-10-CM | POA: Insufficient documentation

## 2013-01-29 DIAGNOSIS — G43909 Migraine, unspecified, not intractable, without status migrainosus: Secondary | ICD-10-CM | POA: Insufficient documentation

## 2013-01-29 DIAGNOSIS — Z3202 Encounter for pregnancy test, result negative: Secondary | ICD-10-CM | POA: Insufficient documentation

## 2013-01-29 LAB — WET PREP, GENITAL
Trich, Wet Prep: NONE SEEN
Yeast Wet Prep HPF POC: NONE SEEN

## 2013-01-29 LAB — URINALYSIS, ROUTINE W REFLEX MICROSCOPIC
Bilirubin Urine: NEGATIVE
Ketones, ur: NEGATIVE mg/dL
Specific Gravity, Urine: 1.02 (ref 1.005–1.030)
Urobilinogen, UA: 0.2 mg/dL (ref 0.0–1.0)

## 2013-01-29 LAB — URINE MICROSCOPIC-ADD ON

## 2013-01-29 MED ORDER — SUMATRIPTAN SUCCINATE 6 MG/0.5ML ~~LOC~~ SOLN
6.0000 mg | Freq: Once | SUBCUTANEOUS | Status: AC
  Administered 2013-01-29: 6 mg via SUBCUTANEOUS
  Filled 2013-01-29: qty 0.5

## 2013-01-29 NOTE — ED Provider Notes (Signed)
CSN: [id number]     Arrival date & time 01/29/13  1714 History   First MD Initiated Contact with Patient 01/29/13 1813     Chief Complaint  Patient presents with  . Abdominal Pain   (Consider location/radiation/quality/duration/timing/severity/associated sxs/prior Treatment) HPI Comments: Patient on depo with last shot July and some irregular spotting since. Denies stds or vaginal discharge.   Patient is a 32 y.o. female presenting with abdominal pain and migraines. The history is provided by the patient.  Abdominal Pain Pain location:  LLQ Pain quality: sharp   Pain radiates to:  Does not radiate Pain severity:  Moderate Onset quality:  Unable to specify Timing:  Intermittent Progression:  Unable to specify Chronicity:  Recurrent Context: not alcohol use, not awakening from sleep, not diet changes, not eating, not laxative use and not recent sexual activity   Context comment:  STates like prior pain with ovarian cyst Relieved by:  Nothing Worsened by:  Nothing tried Ineffective treatments:  None tried Associated symptoms: no anorexia, no chest pain, no cough, no diarrhea, no dysuria, no fever, no nausea, no shortness of breath and no vomiting   Risk factors: no alcohol abuse, no aspirin use, not elderly, has not had multiple surgeries, no NSAID use, not obese and not pregnant   Migraine This is a recurrent problem. The current episode started 6 to 12 hours ago. The problem occurs constantly. The problem has not changed since onset.Associated symptoms include abdominal pain and headaches. Pertinent negatives include no chest pain and no shortness of breath. Nothing aggravates the symptoms. Nothing relieves the symptoms. She has tried nothing (States usually resolves with imitrex but out of imitrex and unable to get to drug store. ) for the symptoms.    Past Medical History  Diagnosis Date  . Hypertension   . Migraine   . Asthma   . Anemia   . Migraine   . Ovarian cyst   . High  cholesterol   . Bronchitis    Past Surgical History  Procedure Laterality Date  . Cesarean section    . Cholecystectomy    . Tonsillectomy    . Tubal ligation     No family history on file. History  Substance Use Topics  . Smoking status: Never Smoker   . Smokeless tobacco: Not on file  . Alcohol Use: No   OB History   Grav Para Term Preterm Abortions TAB SAB Ect Mult Living                 Review of Systems  Constitutional: Negative for fever.  Respiratory: Negative for cough and shortness of breath.   Cardiovascular: Negative for chest pain.  Gastrointestinal: Positive for abdominal pain. Negative for nausea, vomiting, diarrhea and anorexia.  Genitourinary: Negative for dysuria.  Neurological: Positive for headaches.  All other systems reviewed and are negative.    Allergies  Codeine  Home Medications   Current Outpatient Rx  Name  Route  Sig  Dispense  Refill  . diphenhydramine-acetaminophen (TYLENOL PM) 25-500 MG TABS   Oral   Take 1 tablet by mouth at bedtime as needed. sleep         . medroxyPROGESTERone (DEPO-PROVERA) 150 MG/ML injection   Intramuscular   Inject 150 mg into the muscle every 3 (three) months.         . SUMAtriptan (IMITREX) 50 MG tablet   Oral   Take 50 mg by mouth every 2 (two) hours as needed for migraine.  BP 120/79  Pulse 91  Temp(Src) 98.5 F (36.9 C) (Oral)  Resp 20  Ht 5\' 6"  (1.676 m)  Wt 194 lb (87.998 kg)  BMI 31.33 kg/m2  SpO2 99% Physical Exam  Nursing note and vitals reviewed. Constitutional: She is oriented to person, place, and time. She appears well-developed and well-nourished.  HENT:  Head: Normocephalic and atraumatic.  Right Ear: Tympanic membrane and external ear normal.  Left Ear: Tympanic membrane and external ear normal.  Nose: Nose normal. Right sinus exhibits no maxillary sinus tenderness and no frontal sinus tenderness. Left sinus exhibits no maxillary sinus tenderness and no frontal  sinus tenderness.  Eyes: Conjunctivae and EOM are normal. Pupils are equal, round, and reactive to light. Right eye exhibits no nystagmus. Left eye exhibits no nystagmus.  Neck: Normal range of motion. Neck supple.  Cardiovascular: Normal rate, regular rhythm, normal heart sounds and intact distal pulses.   Pulmonary/Chest: Effort normal and breath sounds normal. No respiratory distress. She exhibits no tenderness.  Abdominal: Soft. Bowel sounds are normal. She exhibits no distension and no mass. There is no tenderness.  Genitourinary: Vagina normal and uterus normal. No vaginal discharge found.  Musculoskeletal: Normal range of motion. She exhibits no edema and no tenderness.  Neurological: She is alert and oriented to person, place, and time. She has normal strength and normal reflexes. No sensory deficit. She displays a negative Romberg sign. GCS eye subscore is 4. GCS verbal subscore is 5. GCS motor subscore is 6.  Reflex Scores:      Tricep reflexes are 2+ on the right side and 2+ on the left side.      Bicep reflexes are 2+ on the right side and 2+ on the left side.      Brachioradialis reflexes are 2+ on the right side and 2+ on the left side.      Patellar reflexes are 2+ on the right side and 2+ on the left side.      Achilles reflexes are 2+ on the right side and 2+ on the left side. Patient with normal gait without ataxia, shuffling, spasm, or antalgia. Speech is normal without dysarthria, dysphasia, or aphasia. Muscle strength is 5/5 in bilateral shoulders, elbow flexor and extensors, wrist flexor and extensors, and intrinsic hand muscles. 5/5 bilateral lower extremity hip flexors, extensors, knee flexors and extensors, and ankle dorsi and plantar flexors.    Skin: Skin is warm and dry. No rash noted.  Psychiatric: She has a normal mood and affect. Her behavior is normal. Judgment and thought content normal.    ED Course  Procedures (including critical care time) Labs  Review Labs Reviewed  WET PREP, GENITAL - Abnormal; Notable for the following:    WBC, Wet Prep HPF POC FEW (*)    All other components within normal limits  URINALYSIS, ROUTINE W REFLEX MICROSCOPIC - Abnormal; Notable for the following:    pH 8.5 (*)    Hgb urine dipstick MODERATE (*)    Protein, ur TRACE (*)    All other components within normal limits  URINE MICROSCOPIC-ADD ON - Abnormal; Notable for the following:    Squamous Epithelial / LPF FEW (*)    Bacteria, UA FEW (*)    All other components within normal limits  PREGNANCY, URINE   Results for orders placed during the hospital encounter of 01/29/13  WET PREP, GENITAL      Result Value Range   Yeast Wet Prep HPF POC NONE SEEN  NONE SEEN  Trich, Wet Prep NONE SEEN  NONE SEEN   Clue Cells Wet Prep HPF POC NONE SEEN  NONE SEEN   WBC, Wet Prep HPF POC FEW (*) NONE SEEN  URINALYSIS, ROUTINE W REFLEX MICROSCOPIC      Result Value Range   Color, Urine YELLOW  YELLOW   APPearance CLEAR  CLEAR   Specific Gravity, Urine 1.020  1.005 - 1.030   pH 8.5 (*) 5.0 - 8.0   Glucose, UA NEGATIVE  NEGATIVE mg/dL   Hgb urine dipstick MODERATE (*) NEGATIVE   Bilirubin Urine NEGATIVE  NEGATIVE   Ketones, ur NEGATIVE  NEGATIVE mg/dL   Protein, ur TRACE (*) NEGATIVE mg/dL   Urobilinogen, UA 0.2  0.0 - 1.0 mg/dL   Nitrite NEGATIVE  NEGATIVE   Leukocytes, UA NEGATIVE  NEGATIVE  PREGNANCY, URINE      Result Value Range   Preg Test, Ur NEGATIVE  NEGATIVE  URINE MICROSCOPIC-ADD ON      Result Value Range   Squamous Epithelial / LPF FEW (*) RARE   WBC, UA 0-2  <3 WBC/hpf   RBC / HPF 3-6  <3 RBC/hpf   Bacteria, UA FEW (*) RARE    Imaging Review No results found.  MDM  No diagnosis found. Headache resolved with imitrex.  Patient with normal pelvic exam and abdominal exam.  NO evidence of uti patient with some dark discharge c.w. Spotting on pelvic exam likely accounts for hematuria.   ATypical symptoms for ureteral colic.  Patient given  return precautions and will follow up with pmd.    Hilario Quarry, MD 01/29/13 2102

## 2013-01-29 NOTE — ED Notes (Signed)
Pt was able to void, no In and out needed

## 2013-01-29 NOTE — ED Notes (Signed)
Tech set up pelvic

## 2013-01-29 NOTE — ED Notes (Signed)
Pt c/o headache, n/v, left side pain, and vaginal bleeding since yesterday.  Reports has cyst on left ovary.

## 2013-01-29 NOTE — ED Notes (Signed)
Per MD set up Pelvic

## 2013-01-29 NOTE — ED Notes (Signed)
Patient given discharge instruction, verbalized understand. Patient ambulatory out of the department.  

## 2013-05-07 ENCOUNTER — Emergency Department (HOSPITAL_COMMUNITY): Payer: Medicaid Other

## 2013-05-07 ENCOUNTER — Emergency Department (HOSPITAL_COMMUNITY)
Admission: EM | Admit: 2013-05-07 | Discharge: 2013-05-08 | Disposition: A | Discharge status: Home / Self Care | Payer: Medicaid Other | Attending: Emergency Medicine | Admitting: Emergency Medicine

## 2013-05-07 ENCOUNTER — Encounter (HOSPITAL_COMMUNITY): Payer: Self-pay | Admitting: Emergency Medicine

## 2013-05-07 DIAGNOSIS — J3489 Other specified disorders of nose and nasal sinuses: Secondary | ICD-10-CM | POA: Insufficient documentation

## 2013-05-07 DIAGNOSIS — Z8742 Personal history of other diseases of the female genital tract: Secondary | ICD-10-CM | POA: Insufficient documentation

## 2013-05-07 DIAGNOSIS — R51 Headache: Secondary | ICD-10-CM | POA: Insufficient documentation

## 2013-05-07 DIAGNOSIS — J45901 Unspecified asthma with (acute) exacerbation: Secondary | ICD-10-CM | POA: Insufficient documentation

## 2013-05-07 DIAGNOSIS — Z9089 Acquired absence of other organs: Secondary | ICD-10-CM | POA: Insufficient documentation

## 2013-05-07 DIAGNOSIS — R509 Fever, unspecified: Secondary | ICD-10-CM | POA: Insufficient documentation

## 2013-05-07 DIAGNOSIS — Z79899 Other long term (current) drug therapy: Secondary | ICD-10-CM | POA: Insufficient documentation

## 2013-05-07 DIAGNOSIS — Z8639 Personal history of other endocrine, nutritional and metabolic disease: Secondary | ICD-10-CM | POA: Insufficient documentation

## 2013-05-07 DIAGNOSIS — I10 Essential (primary) hypertension: Secondary | ICD-10-CM | POA: Insufficient documentation

## 2013-05-07 DIAGNOSIS — G43909 Migraine, unspecified, not intractable, without status migrainosus: Secondary | ICD-10-CM | POA: Insufficient documentation

## 2013-05-07 DIAGNOSIS — Z862 Personal history of diseases of the blood and blood-forming organs and certain disorders involving the immune mechanism: Secondary | ICD-10-CM | POA: Insufficient documentation

## 2013-05-07 DIAGNOSIS — J069 Acute upper respiratory infection, unspecified: Secondary | ICD-10-CM

## 2013-05-07 MED ORDER — IBUPROFEN 800 MG PO TABS: 800.0000 mg | ORAL_TABLET | Freq: Three times a day (TID) | ORAL | Status: DC

## 2013-05-07 MED ORDER — ALBUTEROL SULFATE HFA 108 (90 BASE) MCG/ACT IN AERS: 1.0000 | INHALATION_SPRAY | Freq: Four times a day (QID) | RESPIRATORY_TRACT | Status: DC | PRN

## 2013-05-07 MED ORDER — ACETAMINOPHEN 325 MG PO TABS
650.0000 mg | ORAL_TABLET | Freq: Once | ORAL | Status: AC
  Administered 2013-05-07: 650 mg via ORAL
  Filled 2013-05-07: qty 2

## 2013-05-07 NOTE — ED Notes (Signed)
Patient transported to X-ray 

## 2013-05-07 NOTE — ED Notes (Signed)
Pt. reports productive cough , nasal congestion , headache  and chest congestion with sneezing onset today , denies fever or chills.

## 2013-05-07 NOTE — ED Provider Notes (Signed)
CSN: [id number]     Arrival date & time 05/07/13  2025 History  This chart was scribed for non-physician practitioner, Oletha Blend, working with Gavin Pound. Oletta Lamas, MD by Shari Heritage, ED Scribe. This patient was seen in room TR06C/TR06C and the patient's care was started at 10:36 PM.    Chief Complaint  Patient presents with  . Cough  . Nasal Congestion    The history is provided by the patient. No language interpreter was used.   HPI Comments: Nicole Combs is a 32 y.o. female with history of asthma, HTN, high cholesterol who presents to the Emergency Department complaining of an intermittent productive cough onset this morning. There is associated nasal congestion, headache, body aches, chills and subjective fever. Her temperature in the ED has been 99.2 and then 100.3. She has taken Tylenol PM at home without symptom relief. She reports no sick contacts. She does not smoke.  Pt has hx of asthma, states she needs refill of her albuterol inhaler.  Denies chest pain, SOB, or palpitations.   Past Medical History  Diagnosis Date  . Hypertension   . Migraine   . Asthma   . Anemia   . Migraine   . Ovarian cyst   . High cholesterol   . Bronchitis    Past Surgical History  Procedure Laterality Date  . Cesarean section    . Cholecystectomy    . Tonsillectomy    . Tubal ligation    . Knee surgery     No family history on file. History  Substance Use Topics  . Smoking status: Never Smoker   . Smokeless tobacco: Not on file  . Alcohol Use: No   OB History   Grav Para Term Preterm Abortions TAB SAB Ect Mult Living                 Review of Systems  Constitutional: Positive for fever and chills.  HENT: Positive for congestion.   Neurological: Positive for headaches.  All other systems reviewed and are negative.    Allergies  Codeine  Home Medications   Current Outpatient Rx  Name  Route  Sig  Dispense  Refill  . albuterol (PROVENTIL HFA;VENTOLIN HFA) 108 (90  BASE) MCG/ACT inhaler   Inhalation   Inhale 1-2 puffs into the lungs every 6 (six) hours as needed for wheezing or shortness of breath.         . medroxyPROGESTERone (DEPO-PROVERA) 150 MG/ML injection   Intramuscular   Inject 150 mg into the muscle every 3 (three) months.         . SUMAtriptan (IMITREX) 50 MG tablet   Oral   Take 50 mg by mouth every 2 (two) hours as needed for migraine.          Triage Vitals: BP 127/94  Pulse 120  Temp(Src) 99.2 F (37.3 C) (Oral)  Resp 18  Ht 5\' 6"  (1.676 m)  Wt 201 lb (91.173 kg)  BMI 32.46 kg/m2  SpO2 98%  Physical Exam  Nursing note and vitals reviewed. Constitutional: She is oriented to person, place, and time. She appears well-developed and well-nourished. No distress.  HENT:  Head: Normocephalic and atraumatic.  Right Ear: Tympanic membrane and ear canal normal.  Left Ear: Tympanic membrane and ear canal normal.  Nose: Nose normal.  Mouth/Throat: Uvula is midline, oropharynx is clear and moist and mucous membranes are normal. No oropharyngeal exudate, posterior oropharyngeal edema, posterior oropharyngeal erythema or tonsillar abscesses.  Tonsils normal  in appearance bilaterally without exudate, uvula midline, and a peritonsillar abscess, handling secretions appropriately, no difficulty swallowing  Eyes: EOM are normal.  Neck: Normal range of motion. Neck supple. No tracheal deviation present.  Cardiovascular: Normal rate.   Pulmonary/Chest: Effort normal. No respiratory distress. She has wheezes.  Slight wheeze right upper lung field  Musculoskeletal: Normal range of motion.  Neurological: She is alert and oriented to person, place, and time.  Skin: Skin is warm and dry. She is not diaphoretic.  Psychiatric: She has a normal mood and affect. Her behavior is normal.    ED Course  Procedures (including critical care time)  DIAGNOSTIC STUDIES: Oxygen Saturation is 98% on room air, normal by my interpretation.     COORDINATION OF CARE: 9:56 PM- Has been given Tylenol in the ED. CXR negative for bronchitis or pneumonia. Advised symptomatic treatment. Patient informed of current plan for treatment and evaluation and agrees with plan at this time.    Imaging Review Dg Chest 2 View  05/07/2013   CLINICAL DATA:  Cough and fever  EXAM: CHEST  2 VIEW  COMPARISON:  Report of prior study February 27, 2002 is available ; images from that study are not available.  FINDINGS: Lungs are clear. Heart size and pulmonary vascularity are normal. No adenopathy. No bone lesions.  IMPRESSION: No abnormality noted.   Electronically Signed   By: Bretta Bang M.D.   On: 05/07/2013 21:39    EKG Interpretation   None       MDM   1. Viral URI with cough    Chest x-ray negative for acute findings. Constellation of symptoms likely due to the viral URI. Patient given Tylenol in the ED for fever.  Rx albuterol inhaler and motrin.  FU with [facility] if problems occur.  Discussed plan with pt, she agreed.  Return precautions advised.  I personally performed the services described in this documentation, which was scribed in my presence. The recorded information has been reviewed and is accurate.  Garlon Hatchet, PA-C 05/07/13 [phone]

## 2013-05-09 NOTE — ED Provider Notes (Signed)
Medical screening examination/treatment/procedure(s) were performed by non-physician practitioner and as supervising physician I was immediately available for consultation/collaboration.  Ilani Otterson Y. Brit Wernette, MD 05/09/13 1635 

## 2013-05-12 ENCOUNTER — Encounter (HOSPITAL_COMMUNITY): Payer: Self-pay | Admitting: Emergency Medicine

## 2013-05-12 ENCOUNTER — Emergency Department (HOSPITAL_COMMUNITY)
Admission: EM | Admit: 2013-05-12 | Discharge: 2013-05-12 | Disposition: A | Discharge status: Home / Self Care | Payer: Medicaid Other | Attending: Emergency Medicine | Admitting: Emergency Medicine

## 2013-05-12 DIAGNOSIS — Z862 Personal history of diseases of the blood and blood-forming organs and certain disorders involving the immune mechanism: Secondary | ICD-10-CM | POA: Insufficient documentation

## 2013-05-12 DIAGNOSIS — I10 Essential (primary) hypertension: Secondary | ICD-10-CM | POA: Insufficient documentation

## 2013-05-12 DIAGNOSIS — H7290 Unspecified perforation of tympanic membrane, unspecified ear: Secondary | ICD-10-CM | POA: Insufficient documentation

## 2013-05-12 DIAGNOSIS — J45909 Unspecified asthma, uncomplicated: Secondary | ICD-10-CM | POA: Insufficient documentation

## 2013-05-12 DIAGNOSIS — H9190 Unspecified hearing loss, unspecified ear: Secondary | ICD-10-CM | POA: Insufficient documentation

## 2013-05-12 DIAGNOSIS — Z8742 Personal history of other diseases of the female genital tract: Secondary | ICD-10-CM | POA: Insufficient documentation

## 2013-05-12 DIAGNOSIS — E7800 Pure hypercholesterolemia, unspecified: Secondary | ICD-10-CM | POA: Insufficient documentation

## 2013-05-12 DIAGNOSIS — J029 Acute pharyngitis, unspecified: Secondary | ICD-10-CM | POA: Insufficient documentation

## 2013-05-12 DIAGNOSIS — H7291 Unspecified perforation of tympanic membrane, right ear: Secondary | ICD-10-CM

## 2013-05-12 MED ORDER — CIPROFLOXACIN-DEXAMETHASONE 0.3-0.1 % OT SUSP: 4.0000 [drp] | Freq: Two times a day (BID) | OTIC | Status: DC

## 2013-05-12 NOTE — ED Provider Notes (Signed)
CSN: [id number]     Arrival date & time 05/12/13  1930 History  This chart was scribed for non-physician practitioner Felicie Morn, NP working with Celene Kras, MD by Leone Payor, ED Scribe. This patient was seen in room TR09C/TR09C and the patient's care was started at 1930.    Chief Complaint  Patient presents with  . Ear Drainage    The history is provided by the patient. No language interpreter was used.    HPI Comments: Nicole Combs is a 32 y.o. female who presents to the Emergency Department complaining of sudden onset right ear bleeding and pain that began this morning. She reports a similar episode as a child. She denies scratching or any injuries to her affected ear. She also reports having a sore throat as well. She was seen on 05/07/13 for a cough and was advised to take OTC medications to treat her symptoms. She denies any other symptoms at this time.   Past Medical History  Diagnosis Date  . Hypertension   . Migraine   . Asthma   . Anemia   . Migraine   . Ovarian cyst   . High cholesterol   . Bronchitis    Past Surgical History  Procedure Laterality Date  . Cesarean section    . Cholecystectomy    . Tonsillectomy    . Tubal ligation    . Knee surgery     No family history on file. History  Substance Use Topics  . Smoking status: Never Smoker   . Smokeless tobacco: Not on file  . Alcohol Use: No   OB History   Grav Para Term Preterm Abortions TAB SAB Ect Mult Living                 Review of Systems  HENT: Positive for ear discharge ( bleeding), ear pain and sore throat.   Gastrointestinal: Negative for abdominal pain.  All other systems reviewed and are negative.    Allergies  Codeine  Home Medications   Current Outpatient Rx  Name  Route  Sig  Dispense  Refill  . diphenhydramine-acetaminophen (TYLENOL PM) 25-500 MG TABS   Oral   Take 1 tablet by mouth at bedtime as needed (for sleep).         . medroxyPROGESTERone (DEPO-PROVERA) 150  MG/ML injection   Intramuscular   Inject 150 mg into the muscle every 3 (three) months.          BP 121/88  Pulse 116  Temp(Src) 99.5 F (37.5 C) (Oral)  Resp 20  SpO2 97% Physical Exam  Nursing note and vitals reviewed. Constitutional: She is oriented to person, place, and time. She appears well-developed and well-nourished.  HENT:  Head: Normocephalic and atraumatic.  Right Ear: Hearing, tympanic membrane, external ear and ear canal normal.  Left Ear: Hearing normal. Tympanic membrane is bulging. Tympanic membrane is not perforated.  Nose: Nose normal.  Mouth/Throat: Oropharynx is clear and moist. No oropharyngeal exudate.  Left ear canal mildly erythematous. Left TM is bulging. No obvious perforation.   Eyes: Conjunctivae and EOM are normal.  Neck: Normal range of motion. Neck supple.  Cardiovascular: Normal rate, regular rhythm and normal heart sounds.   Pulmonary/Chest: Effort normal and breath sounds normal. No respiratory distress. She has no wheezes. She has no rales.  Abdominal: Soft. She exhibits no distension. There is no tenderness.  Neurological: She is alert and oriented to person, place, and time.  Skin: Skin is warm and dry.  Psychiatric: She has a normal mood and affect.    ED Course  Procedures   DIAGNOSTIC STUDIES: Oxygen Saturation is 97% on RA, adequate by my interpretation.    COORDINATION OF CARE: 8:57 PM Will prescribe Ciprodex. Discussed treatment plan with pt at bedside and pt agreed to plan.   Labs Review Labs Reviewed - No data to display Imaging Review No results found.  EKG Interpretation   None       MDM  Right ear pain with mild hearing loss.  Questionable perforated tympanic membrane on exam.  Recent URI history.  Topical antibiotic, ENT follow-up.  I personally performed the services described in this documentation, which was scribed in my presence. The recorded information has been reviewed and is accurate.   Jimmye Norman, NP 05/13/13 [phone]

## 2013-05-12 NOTE — ED Notes (Signed)
Rt ear bleeding since this am with pain

## 2013-05-13 NOTE — ED Provider Notes (Signed)
Medical screening examination/treatment/procedure(s) were performed by non-physician practitioner and as supervising physician I was immediately available for consultation/collaboration.    Celene Kras, MD 05/13/13 [phone]

## 2013-05-15 ENCOUNTER — Emergency Department (HOSPITAL_COMMUNITY)
Admission: EM | Admit: 2013-05-15 | Discharge: 2013-05-15 | Disposition: A | Discharge status: Home / Self Care | Payer: Medicaid Other | Attending: Emergency Medicine | Admitting: Emergency Medicine

## 2013-05-15 ENCOUNTER — Encounter (HOSPITAL_COMMUNITY): Payer: Self-pay | Admitting: Emergency Medicine

## 2013-05-15 DIAGNOSIS — Z8742 Personal history of other diseases of the female genital tract: Secondary | ICD-10-CM | POA: Insufficient documentation

## 2013-05-15 DIAGNOSIS — J45909 Unspecified asthma, uncomplicated: Secondary | ICD-10-CM | POA: Insufficient documentation

## 2013-05-15 DIAGNOSIS — I10 Essential (primary) hypertension: Secondary | ICD-10-CM | POA: Insufficient documentation

## 2013-05-15 DIAGNOSIS — Z9851 Tubal ligation status: Secondary | ICD-10-CM | POA: Insufficient documentation

## 2013-05-15 DIAGNOSIS — R112 Nausea with vomiting, unspecified: Secondary | ICD-10-CM

## 2013-05-15 DIAGNOSIS — R498 Other voice and resonance disorders: Secondary | ICD-10-CM | POA: Insufficient documentation

## 2013-05-15 DIAGNOSIS — IMO0002 Reserved for concepts with insufficient information to code with codable children: Secondary | ICD-10-CM | POA: Insufficient documentation

## 2013-05-15 DIAGNOSIS — R51 Headache: Secondary | ICD-10-CM | POA: Insufficient documentation

## 2013-05-15 DIAGNOSIS — Z862 Personal history of diseases of the blood and blood-forming organs and certain disorders involving the immune mechanism: Secondary | ICD-10-CM | POA: Insufficient documentation

## 2013-05-15 DIAGNOSIS — Z792 Long term (current) use of antibiotics: Secondary | ICD-10-CM | POA: Insufficient documentation

## 2013-05-15 DIAGNOSIS — Z8639 Personal history of other endocrine, nutritional and metabolic disease: Secondary | ICD-10-CM | POA: Insufficient documentation

## 2013-05-15 DIAGNOSIS — R197 Diarrhea, unspecified: Secondary | ICD-10-CM | POA: Insufficient documentation

## 2013-05-15 DIAGNOSIS — Z9089 Acquired absence of other organs: Secondary | ICD-10-CM | POA: Insufficient documentation

## 2013-05-15 DIAGNOSIS — Z3202 Encounter for pregnancy test, result negative: Secondary | ICD-10-CM | POA: Insufficient documentation

## 2013-05-15 DIAGNOSIS — R490 Dysphonia: Secondary | ICD-10-CM

## 2013-05-15 DIAGNOSIS — G43909 Migraine, unspecified, not intractable, without status migrainosus: Secondary | ICD-10-CM | POA: Insufficient documentation

## 2013-05-15 DIAGNOSIS — Z79899 Other long term (current) drug therapy: Secondary | ICD-10-CM | POA: Insufficient documentation

## 2013-05-15 LAB — URINALYSIS, ROUTINE W REFLEX MICROSCOPIC
Ketones, ur: 15 mg/dL — AB
Nitrite: NEGATIVE
Specific Gravity, Urine: 1.029 (ref 1.005–1.030)
Urobilinogen, UA: 1 mg/dL (ref 0.0–1.0)

## 2013-05-15 LAB — URINE MICROSCOPIC-ADD ON

## 2013-05-15 LAB — CBC WITH DIFFERENTIAL/PLATELET
Eosinophils Absolute: 0 10*3/uL (ref 0.0–0.7)
Eosinophils Relative: 0 % (ref 0–5)
HCT: 40.6 % (ref 36.0–46.0)
Hemoglobin: 14.3 g/dL (ref 12.0–15.0)
Lymphocytes Relative: 15 % (ref 12–46)
Lymphs Abs: 1.4 10*3/uL (ref 0.7–4.0)
MCH: 31 pg (ref 26.0–34.0)
MCHC: 35.2 g/dL (ref 30.0–36.0)
MCV: 87.9 fL (ref 78.0–100.0)
Monocytes Absolute: 0.6 10*3/uL (ref 0.1–1.0)
Monocytes Relative: 7 % (ref 3–12)
Neutrophils Relative %: 78 % — ABNORMAL HIGH (ref 43–77)
RBC: 4.62 MIL/uL (ref 3.87–5.11)

## 2013-05-15 LAB — COMPREHENSIVE METABOLIC PANEL
Alkaline Phosphatase: 82 U/L (ref 39–117)
BUN: 5 mg/dL — ABNORMAL LOW (ref 6–23)
Calcium: 8.8 mg/dL (ref 8.4–10.5)
GFR calc Af Amer: >90 mL/min (ref >90)
Glucose, Bld: 99 mg/dL (ref 70–99)
Potassium: 3.5 mEq/L (ref 3.5–5.1)
Total Bilirubin: 1.8 mg/dL — ABNORMAL HIGH (ref 0.3–1.2)
Total Protein: 7.6 g/dL (ref 6.0–8.3)

## 2013-05-15 LAB — POCT PREGNANCY, URINE: Preg Test, Ur: NEGATIVE

## 2013-05-15 LAB — LIPASE, BLOOD: Lipase: 22 U/L (ref 11–59)

## 2013-05-15 MED ORDER — SODIUM CHLORIDE 0.9 % IV BOLUS (SEPSIS)
1000.0000 mL | Freq: Once | INTRAVENOUS | Status: AC
  Administered 2013-05-15: 1000 mL via INTRAVENOUS

## 2013-05-15 MED ORDER — DIPHENHYDRAMINE HCL 50 MG/ML IJ SOLN
25.0000 mg | Freq: Once | INTRAMUSCULAR | Status: AC
  Administered 2013-05-15: 25 mg via INTRAVENOUS
  Filled 2013-05-15: qty 1

## 2013-05-15 MED ORDER — METOCLOPRAMIDE HCL 5 MG/ML IJ SOLN
10.0000 mg | Freq: Once | INTRAMUSCULAR | Status: AC
  Administered 2013-05-15: 10 mg via INTRAVENOUS
  Filled 2013-05-15: qty 2

## 2013-05-15 MED ORDER — ONDANSETRON 4 MG PO TBDP: 4.0000 mg | ORAL_TABLET | Freq: Three times a day (TID) | ORAL | Status: DC | PRN

## 2013-05-15 NOTE — ED Notes (Signed)
Pt states today she began vomiting and is hoarse.  Pt also c/o headache.

## 2013-05-15 NOTE — ED Notes (Signed)
PT given sprite for PO challenge

## 2013-05-15 NOTE — ED Provider Notes (Signed)
CSN: [id number]     Arrival date & time 05/15/13  1250 History   First MD Initiated Contact with Patient 05/15/13 1848     Chief Complaint  Patient presents with  . Emesis  . Hoarse  . Headache   (Consider location/radiation/quality/duration/timing/severity/associated sxs/prior Treatment) Patient is a 32 y.o. female presenting with vomiting and headaches. The history is provided by the patient and medical records.  Emesis Associated symptoms: diarrhea and headaches   Headache Associated symptoms: diarrhea, nausea and vomiting    This is a 32 year old female with past medical history significant for hypertension, migraines, asthma, hyperlipidemia, presenting to the ED for nausea, non-bloody/non-bloody emesis, and diarrhea, onset this morning upon waking.  No hematochezia. Patient did have recent sick exposure with similar symptoms. Patient also complains of a frontal headache, states it feels similar to her prior migraines. No associated photophobia, phonophobia, aura, dizziness, weakness, tinnitus, or changes in speech.  Patient takes sumatriptan for her migraines-- has taken today without improvement of sx.  Denies fevers, sweats, or chills.  States she has been unable to tolerate PO solids or liquids today.  VS stable on arrival.  Past Medical History  Diagnosis Date  . Hypertension   . Migraine   . Asthma   . Anemia   . Migraine   . Ovarian cyst   . High cholesterol   . Bronchitis    Past Surgical History  Procedure Laterality Date  . Cesarean section    . Cholecystectomy    . Tonsillectomy    . Tubal ligation    . Knee surgery     No family history on file. History  Substance Use Topics  . Smoking status: Never Smoker   . Smokeless tobacco: Not on file  . Alcohol Use: No   OB History   Grav Para Term Preterm Abortions TAB SAB Ect Mult Living                 Review of Systems  Gastrointestinal: Positive for nausea, vomiting and diarrhea.  Neurological: Positive  for headaches.  All other systems reviewed and are negative.    Allergies  Codeine  Home Medications   Current Outpatient Rx  Name  Route  Sig  Dispense  Refill  . albuterol (PROVENTIL HFA;VENTOLIN HFA) 108 (90 BASE) MCG/ACT inhaler   Inhalation   Inhale 2 puffs into the lungs every 6 (six) hours as needed for wheezing or shortness of breath.         . ciprofloxacin-dexamethasone (CIPRODEX) otic suspension   Right Ear   Place 4 drops into the right ear 2 (two) times daily.   7.5 mL   0   . diphenhydramine-acetaminophen (TYLENOL PM) 25-500 MG TABS   Oral   Take 1 tablet by mouth at bedtime as needed (for sleep).         . medroxyPROGESTERone (DEPO-PROVERA) 150 MG/ML injection   Intramuscular   Inject 150 mg into the muscle every 3 (three) months.         . SUMAtriptan Succinate (IMITREX PO)   Oral   Take 1 tablet by mouth every 2 (two) hours as needed (headache).          BP 126/90  Pulse 104  Temp(Src) 98 F (36.7 C) (Oral)  Resp 17  SpO2 97%  Physical Exam  Nursing note and vitals reviewed. Constitutional: She is oriented to person, place, and time. She appears well-developed and well-nourished.  HENT:  Head: Normocephalic and atraumatic.  Right  Ear: Tympanic membrane and ear canal normal.  Left Ear: Tympanic membrane and ear canal normal.  Nose: Nose normal.  Mouth/Throat: Uvula is midline, oropharynx is clear and moist and mucous membranes are normal. No oropharyngeal exudate, posterior oropharyngeal edema, posterior oropharyngeal erythema or tonsillar abscesses.  Postnasal drip noted in oropharynx, tonsils normal in appearance bilaterally without exudate, uvula midline, no peritonsillar abscess, handling secretions appropriately, no difficulty swallowing  Eyes: Conjunctivae and EOM are normal. Pupils are equal, round, and reactive to light.  Neck: Normal range of motion and full passive range of motion without pain. Neck supple. No rigidity.  No  meningeal signs  Cardiovascular: Normal rate, regular rhythm and normal heart sounds.   Pulmonary/Chest: Effort normal and breath sounds normal. No respiratory distress. She has no wheezes.  Abdominal: Soft. Bowel sounds are normal. There is tenderness in the epigastric area and left upper quadrant. There is no rigidity and no guarding.  Abdomen soft, non-distended, mild TTP epigastric and LUQ regions  Musculoskeletal: Normal range of motion.  Neurological: She is alert and oriented to person, place, and time. She has normal strength. She displays no tremor. No cranial nerve deficit or sensory deficit. She displays no seizure activity.  No focal neuro deficits appreciated  Skin: Skin is warm and dry.  Psychiatric: She has a normal mood and affect.    ED Course  Procedures (including critical care time) Labs Review Labs Reviewed  CBC WITH DIFFERENTIAL - Abnormal; Notable for the following:    Neutrophils Relative % 78 (*)    All other components within normal limits  COMPREHENSIVE METABOLIC PANEL - Abnormal; Notable for the following:    BUN 5 (*)    ALT 41 (*)    Total Bilirubin 1.8 (*)    All other components within normal limits  URINALYSIS, ROUTINE W REFLEX MICROSCOPIC - Abnormal; Notable for the following:    Color, Urine AMBER (*)    APPearance CLOUDY (*)    Bilirubin Urine SMALL (*)    Ketones, ur 15 (*)    Protein, ur 30 (*)    All other components within normal limits  URINE MICROSCOPIC-ADD ON - Abnormal; Notable for the following:    Squamous Epithelial / LPF MANY (*)    Bacteria, UA MANY (*)    All other components within normal limits  URINE CULTURE  LIPASE, BLOOD  POCT PREGNANCY, URINE   Imaging Review No results found.  EKG Interpretation   None       MDM   1. Nausea vomiting and diarrhea   2. Hoarseness of voice    Headache without focal neuro deficits-- i doubt TIA, stroke, ICH, SAH, or meningitis.  Other sx suspicious for gastroenteritis.  Will  obtain screening labs and initiate IV fluid hydration as well as migraine cocktail.  Labs reassuring.  U/a with contamination vs infection-- pt is asx at this time, culture pending.  Pt has tolerated PO without recurrent vomiting, headache resolved, and states she feels better.  Abdominal exam is benign and non-surgical.  Pt afebrile, non-toxic appearing, NAD, VS stable- ok for discharge.  Rx zofran.  Fu [facility] if problems occur.  Advised to start with bland diet and progress as tolerated, drink plenty of fluids to keep self hydrated.  Discussed plan with pt and family at bedside-- acknowledged understanding and agreed.  Return precautions advised.  Garlon Hatchet, PA-C 05/16/13 0047  Garlon Hatchet, PA-C 05/16/13 [phone]

## 2013-05-15 NOTE — ED Notes (Signed)
Pt reports productive cough x2 days, today pt began experiencing hoarseness and sore throat - pt c/o n/v x4-5 episodes as well and multiple episodes of diarrhea at home - admits to recent sick contacts. Pt has taken OTC nyquil and used her MDI w/o relief. Pt is tearful on assessment - skin warm and dry - in no acute distress.

## 2013-05-15 NOTE — ED Notes (Signed)
PT asked if she could have something to drink. Provider notified.

## 2013-05-16 NOTE — ED Provider Notes (Signed)
Medical screening examination/treatment/procedure(s) were performed by non-physician practitioner and as supervising physician I was immediately available for consultation/collaboration.  EKG Interpretation   None         Audree Camel, MD 05/16/13 [id number]

## 2013-05-17 LAB — URINE CULTURE: Colony Count: >=100000

## 2013-05-18 ENCOUNTER — Telehealth (HOSPITAL_COMMUNITY): Payer: Self-pay | Admitting: Emergency Medicine

## 2013-05-18 NOTE — ED Notes (Signed)
Post ED Visit - Positive Culture Follow-up  Culture report reviewed by antimicrobial stewardship pharmacist: []  Wes Dulaney, Pharm.D., BCPS [x]  Celedonio Miyamoto, Pharm.D., BCPS []  Georgina Pillion, Pharm.D., BCPS []  [place], [address].D., BCPS, AAHIVP []  Estella Husk, Pharm.D., BCPS, AAHIVP  Positive urine culture Per Johnnette Gourd PA-C, no treatment needed and no further patient follow-up is required at this time.  Zeb Comfort 05/18/2013, 10:33 AM

## 2013-06-10 ENCOUNTER — Emergency Department (HOSPITAL_COMMUNITY)
Admission: EM | Admit: 2013-06-10 | Discharge: 2013-06-10 | Disposition: A | Discharge status: Home / Self Care | Payer: Medicaid Other | Attending: Emergency Medicine | Admitting: Emergency Medicine

## 2013-06-10 ENCOUNTER — Emergency Department (HOSPITAL_COMMUNITY): Payer: Medicaid Other

## 2013-06-10 ENCOUNTER — Encounter (HOSPITAL_COMMUNITY): Payer: Self-pay | Admitting: Emergency Medicine

## 2013-06-10 DIAGNOSIS — Z862 Personal history of diseases of the blood and blood-forming organs and certain disorders involving the immune mechanism: Secondary | ICD-10-CM | POA: Insufficient documentation

## 2013-06-10 DIAGNOSIS — M545 Low back pain, unspecified: Secondary | ICD-10-CM | POA: Insufficient documentation

## 2013-06-10 DIAGNOSIS — Z792 Long term (current) use of antibiotics: Secondary | ICD-10-CM | POA: Insufficient documentation

## 2013-06-10 DIAGNOSIS — Z79899 Other long term (current) drug therapy: Secondary | ICD-10-CM | POA: Insufficient documentation

## 2013-06-10 DIAGNOSIS — Z8742 Personal history of other diseases of the female genital tract: Secondary | ICD-10-CM | POA: Insufficient documentation

## 2013-06-10 DIAGNOSIS — J45909 Unspecified asthma, uncomplicated: Secondary | ICD-10-CM | POA: Insufficient documentation

## 2013-06-10 DIAGNOSIS — I10 Essential (primary) hypertension: Secondary | ICD-10-CM | POA: Insufficient documentation

## 2013-06-10 DIAGNOSIS — Z8639 Personal history of other endocrine, nutritional and metabolic disease: Secondary | ICD-10-CM | POA: Insufficient documentation

## 2013-06-10 DIAGNOSIS — G43909 Migraine, unspecified, not intractable, without status migrainosus: Secondary | ICD-10-CM | POA: Insufficient documentation

## 2013-06-10 DIAGNOSIS — Z9089 Acquired absence of other organs: Secondary | ICD-10-CM | POA: Insufficient documentation

## 2013-06-10 DIAGNOSIS — M549 Dorsalgia, unspecified: Secondary | ICD-10-CM

## 2013-06-10 MED ORDER — TRAMADOL HCL 50 MG PO TABS
50.0000 mg | ORAL_TABLET | Freq: Once | ORAL | Status: AC
Start: 2013-06-10 — End: 2013-06-10
  Administered 2013-06-10: 50 mg via ORAL
  Filled 2013-06-10: qty 1

## 2013-06-10 MED ORDER — CYCLOBENZAPRINE HCL 10 MG PO TABS: 10.0000 mg | ORAL_TABLET | Freq: Two times a day (BID) | ORAL | Status: DC | PRN

## 2013-06-10 MED ORDER — TRAMADOL HCL 50 MG PO TABS: 50.0000 mg | ORAL_TABLET | Freq: Four times a day (QID) | ORAL | Status: DC | PRN

## 2013-06-10 NOTE — ED Notes (Signed)
Pt states that she has had low back pain since yesterday, worse with movement.  States that she thinks her epidural from 2009 has "something to do with it".

## 2013-06-10 NOTE — Discharge Instructions (Signed)
Take Tramdol as needed for pain. Take Flexeril as needed for muscle spasm. You may take these medications together. Refer to attached documents for more information.

## 2013-06-10 NOTE — ED Notes (Signed)
Pt states she has a ride home. 

## 2013-06-10 NOTE — ED Provider Notes (Signed)
CSN: [id number]     Arrival date & time 06/10/13  1339 History  This chart was scribed for non-physician practitioner Emilia BeckKaitlyn Jya Hughston, PA-C working with Laray AngerKathleen M McManus, DO by Leone PayorSonum Patel, ED Scribe. This patient was seen in room WTR6/WTR6 and the patient's care was started at 1339.    Chief Complaint  Patient presents with  . Back Pain    The history is provided by the patient. No language interpreter was used.    HPI Comments: Nicole Combs is a 33 y.o. female who presents to the Emergency Department complaining of constant, gradually worsened, non-radiating lower back pain that began yesterday. She denies any known injuries or trauma. She states certain movements worsen the pain. She has taken ibuprofen this morning without relief. She denies weakness or numbness.   Past Medical History  Diagnosis Date  . Hypertension   . Migraine   . Asthma   . Anemia   . Migraine   . Ovarian cyst   . High cholesterol   . Bronchitis    Past Surgical History  Procedure Laterality Date  . Cesarean section    . Cholecystectomy    . Tonsillectomy    . Tubal ligation    . Knee surgery     No family history on file. History  Substance Use Topics  . Smoking status: Never Smoker   . Smokeless tobacco: Not on file  . Alcohol Use: No   OB History   Grav Para Term Preterm Abortions TAB SAB Ect Mult Living                 Review of Systems  Constitutional: Negative for fever, chills and fatigue.  HENT: Negative for trouble swallowing.   Eyes: Negative for visual disturbance.  Respiratory: Negative for shortness of breath.   Cardiovascular: Negative for chest pain and palpitations.  Gastrointestinal: Negative for nausea, vomiting, abdominal pain and diarrhea.  Genitourinary: Negative for dysuria and difficulty urinating.  Musculoskeletal: Positive for back pain. Negative for arthralgias and neck pain.  Skin: Negative for color change.  Neurological: Negative for dizziness,  weakness and numbness.  Psychiatric/Behavioral: Negative for dysphoric mood.    Allergies  Codeine  Home Medications   Current Outpatient Rx  Name  Route  Sig  Dispense  Refill  . albuterol (PROVENTIL HFA;VENTOLIN HFA) 108 (90 BASE) MCG/ACT inhaler   Inhalation   Inhale 2 puffs into the lungs every 6 (six) hours as needed for wheezing or shortness of breath.         . ciprofloxacin-dexamethasone (CIPRODEX) otic suspension   Right Ear   Place 4 drops into the right ear 2 (two) times daily.   7.5 mL   0   . ibuprofen (ADVIL,MOTRIN) 800 MG tablet   Oral   Take 800 mg by mouth every 8 (eight) hours as needed for moderate pain.         Marland Kitchen. ondansetron (ZOFRAN ODT) 4 MG disintegrating tablet   Oral   Take 1 tablet (4 mg total) by mouth every 8 (eight) hours as needed for nausea.   10 tablet   0   . medroxyPROGESTERone (DEPO-PROVERA) 150 MG/ML injection   Intramuscular   Inject 150 mg into the muscle every 3 (three) months.         . SUMAtriptan Succinate (IMITREX PO)   Oral   Take 1 tablet by mouth every 2 (two) hours as needed (headache).          BP 100/53  Pulse 100  Temp(Src) 98.2 F (36.8 C) (Oral)  Resp 16  SpO2 98% Physical Exam  Nursing note and vitals reviewed. Constitutional: She is oriented to person, place, and time. She appears well-developed and well-nourished.  HENT:  Head: Normocephalic and atraumatic.  Eyes: EOM are normal.  Neck: Normal range of motion. Neck supple.  Cardiovascular: Normal rate.   Pulmonary/Chest: Effort normal.  Abdominal: She exhibits no distension.  Musculoskeletal: Normal range of motion.  L2-L3 tenderness to palpation. No paraspinal tenderness.   Neurological: She is alert and oriented to person, place, and time. Coordination normal.  Skin: Skin is warm and dry.  Psychiatric: She has a normal mood and affect. Her behavior is normal.    ED Course  Procedures (including critical care time)  DIAGNOSTIC  STUDIES: Oxygen Saturation is 98% on RA, normal by my interpretation.    COORDINATION OF CARE: 2:50 PM Will order XRAY of L spine. Discussed treatment plan with pt at bedside and pt agreed to plan.   Labs Review Labs Reviewed - No data to display Imaging Review Dg Lumbar Spine Complete  06/10/2013   CLINICAL DATA:  Low back pain for 2 days.  EXAM: LUMBAR SPINE - COMPLETE 4+ VIEW  COMPARISON:  None.  FINDINGS: There is no fracture or malalignment. Intervertebral disc space height is maintained. No pars interarticularis defect is identified. Cholecystectomy clips are noted.  IMPRESSION: Negative exam.   Electronically Signed   By: Drusilla Kanner M.D.   On: 06/10/2013 15:19    EKG Interpretation   None       MDM   1. Back pain    Patient's xray shows no acute changes. Patient's pain likely muscular in nature. Patient will have Tramadol and Flexeril for pain. Patient advised to follow up with her PCP as needed. No bladder/bowel incontinence or saddle paresthesias.   I personally performed the services described in this documentation, which was scribed in my presence. The recorded information has been reviewed and is accurate.   Emilia Beck, PA-C 06/10/13 1753

## 2013-06-11 ENCOUNTER — Ambulatory Visit: Payer: Medicaid Other

## 2013-06-11 NOTE — ED Provider Notes (Signed)
Medical screening examination/treatment/procedure(s) were performed by non-physician practitioner and as supervising physician I was immediately available for consultation/collaboration.  EKG Interpretation   None        Clint Strupp R. Demontrez Rindfleisch, MD 06/11/13 0004 

## 2013-06-22 ENCOUNTER — Emergency Department (HOSPITAL_COMMUNITY)
Admission: EM | Admit: 2013-06-22 | Discharge: 2013-06-23 | Disposition: A | Discharge status: Home / Self Care | Payer: Medicaid Other | Attending: Emergency Medicine | Admitting: Emergency Medicine

## 2013-06-22 ENCOUNTER — Encounter (HOSPITAL_COMMUNITY): Payer: Self-pay | Admitting: Emergency Medicine

## 2013-06-22 DIAGNOSIS — E7800 Pure hypercholesterolemia, unspecified: Secondary | ICD-10-CM | POA: Insufficient documentation

## 2013-06-22 DIAGNOSIS — N72 Inflammatory disease of cervix uteri: Secondary | ICD-10-CM | POA: Insufficient documentation

## 2013-06-22 DIAGNOSIS — R509 Fever, unspecified: Secondary | ICD-10-CM | POA: Insufficient documentation

## 2013-06-22 DIAGNOSIS — R109 Unspecified abdominal pain: Secondary | ICD-10-CM | POA: Insufficient documentation

## 2013-06-22 DIAGNOSIS — R110 Nausea: Secondary | ICD-10-CM | POA: Insufficient documentation

## 2013-06-22 DIAGNOSIS — Z79899 Other long term (current) drug therapy: Secondary | ICD-10-CM | POA: Insufficient documentation

## 2013-06-22 DIAGNOSIS — J45909 Unspecified asthma, uncomplicated: Secondary | ICD-10-CM | POA: Insufficient documentation

## 2013-06-22 DIAGNOSIS — N760 Acute vaginitis: Secondary | ICD-10-CM | POA: Insufficient documentation

## 2013-06-22 DIAGNOSIS — I10 Essential (primary) hypertension: Secondary | ICD-10-CM | POA: Insufficient documentation

## 2013-06-22 DIAGNOSIS — E876 Hypokalemia: Secondary | ICD-10-CM | POA: Insufficient documentation

## 2013-06-22 DIAGNOSIS — Z3202 Encounter for pregnancy test, result negative: Secondary | ICD-10-CM | POA: Insufficient documentation

## 2013-06-22 NOTE — ED Notes (Signed)
Pt. reports sudden onset left anterior hip bone pain onset this evening , no dysuria or injury.

## 2013-06-23 ENCOUNTER — Emergency Department (HOSPITAL_COMMUNITY): Payer: Medicaid Other

## 2013-06-23 LAB — URINALYSIS, ROUTINE W REFLEX MICROSCOPIC
Bilirubin Urine: NEGATIVE
Glucose, UA: NEGATIVE mg/dL
Ketones, ur: NEGATIVE mg/dL
LEUKOCYTES UA: NEGATIVE
NITRITE: NEGATIVE
PROTEIN: NEGATIVE mg/dL
SPECIFIC GRAVITY, URINE: 1.027 (ref 1.005–1.030)
Urobilinogen, UA: 1 mg/dL (ref 0.0–1.0)
pH: 6.5 (ref 5.0–8.0)

## 2013-06-23 LAB — POCT I-STAT, CHEM 8
BUN: 7 mg/dL (ref 6–23)
CALCIUM ION: 1.12 mmol/L (ref 1.12–1.23)
Chloride: 104 mEq/L (ref 96–112)
Creatinine, Ser: 0.6 mg/dL (ref 0.50–1.10)
Glucose, Bld: 99 mg/dL (ref 70–99)
HEMATOCRIT: 42 % (ref 36.0–46.0)
Hemoglobin: 14.3 g/dL (ref 12.0–15.0)
Potassium: 3.4 mEq/L — ABNORMAL LOW (ref 3.7–5.3)
Sodium: 141 mEq/L (ref 137–147)
TCO2: 24 mmol/L (ref 0–100)

## 2013-06-23 LAB — URINE MICROSCOPIC-ADD ON

## 2013-06-23 LAB — CBC
HCT: 40.6 % (ref 36.0–46.0)
Hemoglobin: 13.9 g/dL (ref 12.0–15.0)
MCH: 30.7 pg (ref 26.0–34.0)
MCHC: 34.2 g/dL (ref 30.0–36.0)
MCV: 89.6 fL (ref 78.0–100.0)
PLATELETS: 207 10*3/uL (ref 150–400)
RBC: 4.53 MIL/uL (ref 3.87–5.11)
RDW: 13.6 % (ref 11.5–15.5)
WBC: 7.1 10*3/uL (ref 4.0–10.5)

## 2013-06-23 LAB — WET PREP, GENITAL
Trich, Wet Prep: NONE SEEN
Yeast Wet Prep HPF POC: NONE SEEN

## 2013-06-23 LAB — RPR: RPR: NONREACTIVE

## 2013-06-23 LAB — POCT PREGNANCY, URINE: Preg Test, Ur: NEGATIVE

## 2013-06-23 LAB — GC/CHLAMYDIA PROBE AMP
CT PROBE, AMP APTIMA: NEGATIVE
GC Probe RNA: NEGATIVE

## 2013-06-23 LAB — HIV ANTIBODY (ROUTINE TESTING W REFLEX): HIV: NONREACTIVE

## 2013-06-23 MED ORDER — CEFTRIAXONE SODIUM 250 MG IJ SOLR
250.0000 mg | Freq: Once | INTRAMUSCULAR | Status: AC
  Administered 2013-06-23: 250 mg via INTRAMUSCULAR
  Filled 2013-06-23: qty 250

## 2013-06-23 MED ORDER — METRONIDAZOLE 500 MG PO TABS: 500.0000 mg | ORAL_TABLET | Freq: Two times a day (BID) | ORAL | Status: DC

## 2013-06-23 MED ORDER — POTASSIUM CHLORIDE CRYS ER 20 MEQ PO TBCR
40.0000 meq | EXTENDED_RELEASE_TABLET | Freq: Once | ORAL | Status: AC
  Administered 2013-06-23: 40 meq via ORAL
  Filled 2013-06-23: qty 2

## 2013-06-23 MED ORDER — METRONIDAZOLE 500 MG PO TABS
500.0000 mg | ORAL_TABLET | Freq: Once | ORAL | Status: AC
  Administered 2013-06-23: 500 mg via ORAL
  Filled 2013-06-23: qty 1

## 2013-06-23 MED ORDER — METOCLOPRAMIDE HCL 10 MG PO TABS: 10.0000 mg | ORAL_TABLET | Freq: Four times a day (QID) | ORAL | Status: DC | PRN

## 2013-06-23 MED ORDER — HYDROCODONE-ACETAMINOPHEN 5-325 MG PO TABS: 1.0000 | ORAL_TABLET | Freq: Four times a day (QID) | ORAL | Status: DC | PRN

## 2013-06-23 MED ORDER — MORPHINE SULFATE 4 MG/ML IJ SOLN
4.0000 mg | Freq: Once | INTRAMUSCULAR | Status: AC
  Administered 2013-06-23: 4 mg via INTRAVENOUS
  Filled 2013-06-23: qty 1

## 2013-06-23 MED ORDER — AZITHROMYCIN 250 MG PO TABS
1000.0000 mg | ORAL_TABLET | Freq: Once | ORAL | Status: AC
Start: 2013-06-23 — End: 2013-06-23
  Administered 2013-06-23: 1000 mg via ORAL
  Filled 2013-06-23: qty 4

## 2013-06-23 MED ORDER — METOCLOPRAMIDE HCL 5 MG/ML IJ SOLN
10.0000 mg | Freq: Once | INTRAMUSCULAR | Status: AC
  Administered 2013-06-23: 10 mg via INTRAVENOUS
  Filled 2013-06-23: qty 2

## 2013-06-23 NOTE — ED Notes (Signed)
Pelvic cart set up at bedside  

## 2013-06-23 NOTE — Discharge Instructions (Signed)
Cervicitis Use a condom every time that you have sex. [facility] to be seen if he still had significant discomfort by next week Cervicitis is a soreness and puffiness (inflammation) of the cervix.  HOME CARE  Do not have sex (intercourse) until your doctor says it is okay.  Do not have sex until your partner is treated or as told by your doctor.  Take your antibiotic medicine as told. Finish it even if you start to feel better. GET HELP IF:   Yours symptoms that brought you to the doctor come back.  You have a fever. MAKE SURE YOU:   Understand these instructions.  Will watch your condition.  Will get help right away if you are not doing well or get worse. Document Released: 02/14/2008 Document Revised: 01/07/2013 Document Reviewed: 10/29/2012 [facility] Patient Information 2014 [place], [place].

## 2013-06-23 NOTE — ED Provider Notes (Addendum)
CSN: [id number]     Arrival date & time 06/22/13  2351 History   First MD Initiated Contact with Patient 06/23/13 0000     Chief Complaint  Patient presents with  . Hip Pain   (Consider location/radiation/quality/duration/timing/severity/associated sxs/prior Treatment) HPI Complains of left hip pain (points to left flank and left lower quadrant) onset last night. Constant worse with walking improved with remaining still admits to nausea no vomiting. Admits to subjective fever. No treatment prior to coming here. Pain is constant moderate, dull. Last menstrual period currently. Denies vaginal discharge. No other associated symptoms. Has not had similar pain before. Past Medical History  Diagnosis Date  . Hypertension   . Migraine   . Asthma   . Anemia   . Migraine   . Ovarian cyst   . High cholesterol   . Bronchitis    Past Surgical History  Procedure Laterality Date  . Cesarean section    . Cholecystectomy    . Tonsillectomy    . Tubal ligation    . Knee surgery     ovarian cysts No family history on file. History  Substance Use Topics  . Smoking status: Never Smoker   . Smokeless tobacco: Not on file  . Alcohol Use: No   OB History   Grav Para Term Preterm Abortions TAB SAB Ect Mult Living                 Review of Systems  Constitutional: Negative.   HENT: Negative.   Respiratory: Negative.   Cardiovascular: Negative.   Gastrointestinal: Negative.   Genitourinary: Positive for flank pain and pelvic pain.       Currently on menses  Musculoskeletal: Negative.   Skin: Negative.   Neurological: Negative.   Psychiatric/Behavioral: Negative.   All other systems reviewed and are negative.    Allergies  Codeine  Home Medications   Current Outpatient Rx  Name  Route  Sig  Dispense  Refill  . albuterol (PROVENTIL HFA;VENTOLIN HFA) 108 (90 BASE) MCG/ACT inhaler   Inhalation   Inhale 2 puffs into the lungs every 6 (six) hours as needed for wheezing or shortness  of breath.         . medroxyPROGESTERone (DEPO-PROVERA) 150 MG/ML injection   Intramuscular   Inject 150 mg into the muscle every 3 (three) months.         . SUMAtriptan Succinate (IMITREX PO)   Oral   Take 1 tablet by mouth every 2 (two) hours as needed (headache).         . ciprofloxacin-dexamethasone (CIPRODEX) otic suspension   Right Ear   Place 4 drops into the right ear 2 (two) times daily.   7.5 mL   0    BP 133/95  Pulse 114  Temp(Src) 97.5 F (36.4 C) (Oral)  Resp 18  SpO2 97% Physical Exam  Nursing note and vitals reviewed. Constitutional: She is oriented to person, place, and time. She appears well-developed and well-nourished.  HENT:  Head: Normocephalic and atraumatic.  Eyes: Conjunctivae are normal. Pupils are equal, round, and reactive to light.  Neck: Neck supple. No tracheal deviation present. No thyromegaly present.  Cardiovascular: Normal rate and regular rhythm.   No murmur heard. Pulmonary/Chest: Effort normal and breath sounds normal.  Abdominal: Soft. Bowel sounds are normal. She exhibits no distension. There is tenderness.  Tender left suprapubic area  Genitourinary:  Left flank tenderness genitalia no external lesion. Slight amount of brownish blood in the vault. Cervical os  closed. Mild cervical motion tenderness. Left adnexal tenderness. No masses. Right adnexa normal.  Musculoskeletal: Normal range of motion. She exhibits no edema and no tenderness.  Neurological: She is alert and oriented to person, place, and time. Coordination normal.  Skin: Skin is warm and dry. No rash noted.  Psychiatric: She has a normal mood and affect.    ED Course  Procedures (including critical care time) Labs Review Labs Reviewed  GC/CHLAMYDIA PROBE AMP  URINALYSIS, ROUTINE W REFLEX MICROSCOPIC  RPR  HIV ANTIBODY (ROUTINE TESTING)  CBC   Imaging Review No results found.  EKG Interpretation   None      1:45 AM pain under control after treatment  with intravenous morphine nad reglan Results for orders placed during the hospital encounter of 06/22/13  WET PREP, GENITAL      Result Value Range   Yeast Wet Prep HPF POC NONE SEEN  NONE SEEN   Trich, Wet Prep NONE SEEN  NONE SEEN   Clue Cells Wet Prep HPF POC MODERATE (*) NONE SEEN   WBC, Wet Prep HPF POC MODERATE (*) NONE SEEN  URINALYSIS, ROUTINE W REFLEX MICROSCOPIC      Result Value Range   Color, Urine YELLOW  YELLOW   APPearance CLOUDY (*) CLEAR   Specific Gravity, Urine 1.027  1.005 - 1.030   pH 6.5  5.0 - 8.0   Glucose, UA NEGATIVE  NEGATIVE mg/dL   Hgb urine dipstick LARGE (*) NEGATIVE   Bilirubin Urine NEGATIVE  NEGATIVE   Ketones, ur NEGATIVE  NEGATIVE mg/dL   Protein, ur NEGATIVE  NEGATIVE mg/dL   Urobilinogen, UA 1.0  0.0 - 1.0 mg/dL   Nitrite NEGATIVE  NEGATIVE   Leukocytes, UA NEGATIVE  NEGATIVE  CBC      Result Value Range   WBC 7.1  4.0 - 10.5 K/uL   RBC 4.53  3.87 - 5.11 MIL/uL   Hemoglobin 13.9  12.0 - 15.0 g/dL   HCT 16.1  09.6 - 04.5 %   MCV 89.6  78.0 - 100.0 fL   MCH 30.7  26.0 - 34.0 pg   MCHC 34.2  30.0 - 36.0 g/dL   RDW 40.9  81.1 - 91.4 %   Platelets 207  150 - 400 K/uL  URINE MICROSCOPIC-ADD ON      Result Value Range   Squamous Epithelial / LPF FEW (*) RARE   WBC, UA 0-2  <3 WBC/hpf   RBC / HPF 7-10  <3 RBC/hpf   Bacteria, UA RARE  RARE   Urine-Other MUCOUS PRESENT    POCT PREGNANCY, URINE      Result Value Range   Preg Test, Ur NEGATIVE  NEGATIVE  POCT I-STAT, CHEM 8      Result Value Range   Sodium 141  137 - 147 mEq/L   Potassium 3.4 (*) 3.7 - 5.3 mEq/L   Chloride 104  96 - 112 mEq/L   BUN 7  6 - 23 mg/dL   Creatinine, Ser 7.82  0.50 - 1.10 mg/dL   Glucose, Bld 99  70 - 99 mg/dL   Calcium, Ion 9.56  2.13 - 1.23 mmol/L   TCO2 24  0 - 100 mmol/L   Hemoglobin 14.3  12.0 - 15.0 g/dL   HCT 08.6  57.8 - 46.9 %   Dg Lumbar Spine Complete  06/10/2013   CLINICAL DATA:  Low back pain for 2 days.  EXAM: LUMBAR SPINE - COMPLETE 4+ VIEW   COMPARISON:  None.  FINDINGS: There is no fracture or malalignment. Intervertebral disc space height is maintained. No pars interarticularis defect is identified. Cholecystectomy clips are noted.  IMPRESSION: Negative exam.   Electronically Signed   By: Drusilla Kanner M.D.   On: 06/10/2013 15:19   US Transvaginal Non-ob  06/23/2013   CLINICAL DATA:  Left adnexal tenderness.  EXAM: TRANSABDOMINAL AND TRANSVAGINAL ULTRASOUND OF PELVIS  DOPPLER ULTRASOUND OF OVARIES  TECHNIQUE: Both transabdominal and transvaginal ultrasound examinations of the pelvis were performed. Transabdominal technique was performed for global imaging of the pelvis including uterus, ovaries, adnexal regions, and pelvic cul-de-sac.  It was necessary to proceed with endovaginal exam following the transabdominal exam to visualize the adnexae. Color and duplex Doppler ultrasound was utilized to evaluate blood flow to the ovaries.  COMPARISON:  10/02/2012  FINDINGS: Uterus  Measurements: 8 x 4 x 5 cm. Cesarean section scar which shadowing in the anterior lower uterine segment. No mass.  Endometrium  Thickness: 5 mm. There is a small volume of fluid within the fundic endometrial canal, of doubtful clinical significance. There is no evidence of obstructing vascular mass.  Right ovary  Measurements: 3.2 x 2.4 x 2.7 cm. Normal appearance/no adnexal mass.  Left ovary  Measurements: 3.3 x 2.8 x 2.5 cm. Normal appearance/no adnexal mass.  Pulsed Doppler evaluation of both ovaries demonstrates normal low-resistance arterial waveforms. Venous waveforms are less clearly demonstrated, but present.  Other findings  No free fluid.  IMPRESSION: 1. Negative for ovarian torsion or other acute pelvic abnormality. 2. Small volume fluid in the fundic endometrial canal, of doubtful clinical significance.   Electronically Signed   By: Tiburcio Pea M.D.   On: 06/23/2013 03:05   US Pelvis Complete  06/23/2013   CLINICAL DATA:  Left adnexal tenderness.  EXAM:  TRANSABDOMINAL AND TRANSVAGINAL ULTRASOUND OF PELVIS  DOPPLER ULTRASOUND OF OVARIES  TECHNIQUE: Both transabdominal and transvaginal ultrasound examinations of the pelvis were performed. Transabdominal technique was performed for global imaging of the pelvis including uterus, ovaries, adnexal regions, and pelvic cul-de-sac.  It was necessary to proceed with endovaginal exam following the transabdominal exam to visualize the adnexae. Color and duplex Doppler ultrasound was utilized to evaluate blood flow to the ovaries.  COMPARISON:  10/02/2012  FINDINGS: Uterus  Measurements: 8 x 4 x 5 cm. Cesarean section scar which shadowing in the anterior lower uterine segment. No mass.  Endometrium  Thickness: 5 mm. There is a small volume of fluid within the fundic endometrial canal, of doubtful clinical significance. There is no evidence of obstructing vascular mass.  Right ovary  Measurements: 3.2 x 2.4 x 2.7 cm. Normal appearance/no adnexal mass.  Left ovary  Measurements: 3.3 x 2.8 x 2.5 cm. Normal appearance/no adnexal mass.  Pulsed Doppler evaluation of both ovaries demonstrates normal low-resistance arterial waveforms. Venous waveforms are less clearly demonstrated, but present.  Other findings  No free fluid.  IMPRESSION: 1. Negative for ovarian torsion or other acute pelvic abnormality. 2. Small volume fluid in the fundic endometrial canal, of doubtful clinical significance.   Electronically Signed   By: Tiburcio Pea M.D.   On: 06/23/2013 03:05   [place] Art/ven Flow Abd Pelv Doppler  06/23/2013   CLINICAL DATA:  Left adnexal tenderness.  EXAM: TRANSABDOMINAL AND TRANSVAGINAL ULTRASOUND OF PELVIS  DOPPLER ULTRASOUND OF OVARIES  TECHNIQUE: Both transabdominal and transvaginal ultrasound examinations of the pelvis were performed. Transabdominal technique was performed for global imaging of the pelvis including uterus, ovaries, adnexal regions, and pelvic cul-de-sac.  It  was necessary to proceed with endovaginal exam  following the transabdominal exam to visualize the adnexae. Color and duplex Doppler ultrasound was utilized to evaluate blood flow to the ovaries.  COMPARISON:  10/02/2012  FINDINGS: Uterus  Measurements: 8 x 4 x 5 cm. Cesarean section scar which shadowing in the anterior lower uterine segment. No mass.  Endometrium  Thickness: 5 mm. There is a small volume of fluid within the fundic endometrial canal, of doubtful clinical significance. There is no evidence of obstructing vascular mass.  Right ovary  Measurements: 3.2 x 2.4 x 2.7 cm. Normal appearance/no adnexal mass.  Left ovary  Measurements: 3.3 x 2.8 x 2.5 cm. Normal appearance/no adnexal mass.  Pulsed Doppler evaluation of both ovaries demonstrates normal low-resistance arterial waveforms. Venous waveforms are less clearly demonstrated, but present.  Other findings  No free fluid.  IMPRESSION: 1. Negative for ovarian torsion or other acute pelvic abnormality. 2. Small volume fluid in the fundic endometrial canal, of doubtful clinical significance.   Electronically Signed   By: Tiburcio Pea M.D.   On: 06/23/2013 03:05     MDM  No diagnosis found. In light of clue cells, and tenderness on pelvic exam we'll treat with antibiotics. Plan prescription for Flagyl Norco, Reglan. Referral [facility] sex encouraged Diagnosis #1 cervicitis #2 vaginitis #3 hypokalemia   Doug Sou, MD 06/23/13 1610  Doug Sou, MD 06/23/13 [phone]

## 2013-06-27 ENCOUNTER — Ambulatory Visit: Payer: Medicaid Other | Attending: Internal Medicine | Admitting: Internal Medicine

## 2013-06-27 VITALS — BP 131/91 | HR 96 | Temp 98.6°F | Resp 16 | Ht 66.0 in | Wt 198.0 lb

## 2013-06-27 DIAGNOSIS — I10 Essential (primary) hypertension: Secondary | ICD-10-CM | POA: Insufficient documentation

## 2013-06-27 DIAGNOSIS — E7800 Pure hypercholesterolemia, unspecified: Secondary | ICD-10-CM | POA: Insufficient documentation

## 2013-06-27 DIAGNOSIS — J45909 Unspecified asthma, uncomplicated: Secondary | ICD-10-CM | POA: Insufficient documentation

## 2013-06-27 DIAGNOSIS — M549 Dorsalgia, unspecified: Secondary | ICD-10-CM

## 2013-06-27 DIAGNOSIS — Z79899 Other long term (current) drug therapy: Secondary | ICD-10-CM | POA: Insufficient documentation

## 2013-06-27 DIAGNOSIS — Z23 Encounter for immunization: Secondary | ICD-10-CM

## 2013-06-27 DIAGNOSIS — G43909 Migraine, unspecified, not intractable, without status migrainosus: Secondary | ICD-10-CM | POA: Insufficient documentation

## 2013-06-27 DIAGNOSIS — IMO0001 Reserved for inherently not codable concepts without codable children: Secondary | ICD-10-CM

## 2013-06-27 DIAGNOSIS — E785 Hyperlipidemia, unspecified: Secondary | ICD-10-CM | POA: Insufficient documentation

## 2013-06-27 DIAGNOSIS — G8929 Other chronic pain: Secondary | ICD-10-CM | POA: Insufficient documentation

## 2013-06-27 DIAGNOSIS — Z309 Encounter for contraceptive management, unspecified: Secondary | ICD-10-CM

## 2013-06-27 MED ORDER — MEDROXYPROGESTERONE ACETATE 150 MG/ML IM SUSP
150.0000 mg | Freq: Once | INTRAMUSCULAR | Status: AC
  Administered 2013-06-27: 150 mg via INTRAMUSCULAR

## 2013-06-27 MED ORDER — NAPROXEN 500 MG PO TABS: 500.0000 mg | ORAL_TABLET | Freq: Two times a day (BID) | ORAL | Status: DC

## 2013-06-27 NOTE — Progress Notes (Signed)
Pt is here requesting a depo shot and flu shot. Pt states that she has cyst on her right side. Pt reports having frequent migraines.

## 2013-06-27 NOTE — Progress Notes (Signed)
Patient ID: Nicole Combs, female   DOB: [birth year], 33 y.o.   MRN: [id number]   Nicole Combs, is a 33 y.o. female  WUJ:[id number]  [id number]  DOB - 14-Dec-1980  CC: No chief complaint on file.      HPI: Nicole Combs is a 33 y.o. female here today to establish medical care. Patient has history of high blood pressure, asthma, dyslipidemia, chronic back pain. She was seen recently in the emergency department as her laboratory tests were ordered. Patient is here to followup of the lab results. She also want to get back on her Depo-Provera as well as get a flu shot. She does not have any significant complaint today. Her laboratory results were reviewed and discussed with patient mostly within normal limits. Patient does not smoke cigarette, she does not drink alcohol. She had been told in the past that her blood pressure was high, at that time she was placed on medication but subsequently taken off because of low blood pressure. She has since been on no medication, blood pressure ranges between 110 and 140 systolic over 80-95 diastolic. She has migraine headache on Imitrex as needed. She has all her medications except for pain Patient has No headache, No chest pain, No abdominal pain - No Nausea, No new weakness tingling or numbness, No Cough - SOB.  Allergies  Allergen Reactions  . Codeine Itching and Rash   Past Medical History  Diagnosis Date  . Hypertension   . Migraine   . Asthma   . Anemia   . Migraine   . Ovarian cyst   . High cholesterol   . Bronchitis    Current Outpatient Prescriptions on File Prior to Visit  Medication Sig Dispense Refill  . albuterol (PROVENTIL HFA;VENTOLIN HFA) 108 (90 BASE) MCG/ACT inhaler Inhale 2 puffs into the lungs every 6 (six) hours as needed for wheezing or shortness of breath.      . ciprofloxacin-dexamethasone (CIPRODEX) otic suspension Place 4 drops into the right ear 2 (two) times daily.  7.5 mL  0  . HYDROcodone-acetaminophen  (NORCO) 5-325 MG per tablet Take 1 tablet by mouth every 6 (six) hours as needed for severe pain.  10 tablet  0  . medroxyPROGESTERone (DEPO-PROVERA) 150 MG/ML injection Inject 150 mg into the muscle every 3 (three) months.      . metoCLOPramide (REGLAN) 10 MG tablet Take 1 tablet (10 mg total) by mouth every 6 (six) hours as needed for nausea (nausea/headache).  6 tablet  0  . metroNIDAZOLE (FLAGYL) 500 MG tablet Take 1 tablet (500 mg total) by mouth 2 (two) times daily.  14 tablet  0  . SUMAtriptan Succinate (IMITREX PO) Take 1 tablet by mouth every 2 (two) hours as needed (headache).       No current facility-administered medications on file prior to visit.   History reviewed. No pertinent family history. History   Social History  . Marital Status: Single    Spouse Name: N/A    Number of Children: N/A  . Years of Education: N/A   Occupational History  . Not on file.   Social History Main Topics  . Smoking status: Never Smoker   . Smokeless tobacco: Not on file  . Alcohol Use: No  . Drug Use: No  . Sexual Activity: Yes    Birth Control/ Protection: Injection   Other Topics Concern  . Not on file   Social History Narrative  . No narrative on file    Review of  Systems: Constitutional: Negative for fever, chills, diaphoresis, activity change, appetite change and fatigue. HENT: Negative for ear pain, nosebleeds, congestion, facial swelling, rhinorrhea, neck pain, neck stiffness and ear discharge.  Eyes: Negative for pain, discharge, redness, itching and visual disturbance. Respiratory: Negative for cough, choking, chest tightness, shortness of breath, wheezing and stridor.  Cardiovascular: Negative for chest pain, palpitations and leg swelling. Gastrointestinal: Negative for abdominal distention. Genitourinary: Negative for dysuria, urgency, frequency, hematuria, flank pain, decreased urine volume, difficulty urinating and dyspareunia.  Musculoskeletal: Negative for back  pain, joint swelling, arthralgia and gait problem. Neurological: Negative for dizziness, tremors, seizures, syncope, facial asymmetry, speech difficulty, weakness, light-headedness, numbness and headaches.  Hematological: Negative for adenopathy. Does not bruise/bleed easily. Psychiatric/Behavioral: Negative for hallucinations, behavioral problems, confusion, dysphoric mood, decreased concentration and agitation.    Objective:   Filed Vitals:   06/27/13 0953  BP: 131/91  Pulse: 96  Temp: 98.6 F (37 C)  Resp: 16    Physical Exam: Constitutional: Patient appears well-developed and well-nourished. No distress. Obese  HENT: Normocephalic, atraumatic, External right and left ear normal. Oropharynx is clear and moist.  Eyes: Conjunctivae and EOM are normal. PERRLA, no scleral icterus. Neck: Normal ROM. Neck supple. No JVD. No tracheal deviation. No thyromegaly. CVS: RRR, S1/S2 +, no murmurs, no gallops, no carotid bruit.  Pulmonary: Effort and breath sounds normal, no stridor, rhonchi, wheezes, rales.  Abdominal: Soft. BS +, no distension, tenderness, rebound or guarding.  Musculoskeletal: Normal range of motion. No edema and no tenderness.  Lymphadenopathy: No lymphadenopathy noted, cervical, inguinal or axillary Neuro: Alert. Normal reflexes, muscle tone coordination. No cranial nerve deficit. Skin: Skin is warm and dry. No rash noted. Not diaphoretic. No erythema. No pallor. Psychiatric: Normal mood and affect. Behavior, judgment, thought content normal.  Lab Results  Component Value Date   WBC 7.1 06/23/2013   HGB 14.3 06/23/2013   HCT 42.0 06/23/2013   MCV 89.6 06/23/2013   PLT 207 06/23/2013   Lab Results  Component Value Date   CREATININE 0.60 06/23/2013   BUN 7 06/23/2013   NA 141 06/23/2013   K 3.4* 06/23/2013   CL 104 06/23/2013   CO2 28 05/15/2013    No results found for this basename: HGBA1C   Lipid Panel  No results found for this basename: chol, trig, hdl, cholhdl, vldl,  ldlcalc       Assessment and plan:   1. Back pain  - naproxen (NAPROSYN) 500 MG tablet; Take 1 tablet (500 mg total) by mouth 2 (two) times daily with a meal.  Dispense: 30 tablet; Refill: 0  Patient was extensively counseled about nutrition and exercise  Return in about 6 months (around 12/25/2013), or if symptoms worsen or fail to improve, for Routine Follow Up.  The patient was given clear instructions to go to ER or return to medical center if symptoms don't improve, worsen or new problems develop. The patient verbalized understanding. The patient was told to call to get lab results if they haven't heard anything in the next week.     Jeanann LewandowskyJEGEDE, Jamisha Hoeschen, MD, MHA, Maxwell CaulFACP, FAAP [facility] [place], [place] [phone]   06/27/2013, 10:26 AM

## 2013-06-29 ENCOUNTER — Telehealth: Payer: Self-pay

## 2013-06-29 ENCOUNTER — Encounter: Payer: Medicaid Other | Admitting: Obstetrics & Gynecology

## 2013-06-29 NOTE — Telephone Encounter (Signed)
Pt. Called and stated she had questions about whether or not she should continue taking a medication. Did not specify which medication

## 2013-06-30 NOTE — Telephone Encounter (Signed)
Called pt. Who asked if she should continue taking Flagyl. Advised pt. That she should take the medication until its gone. Pt. Verbalized understanding and stated she  will be here in March.

## 2013-07-08 ENCOUNTER — Telehealth: Payer: Self-pay | Admitting: *Deleted

## 2013-07-08 ENCOUNTER — Encounter (HOSPITAL_COMMUNITY): Payer: Self-pay | Admitting: Emergency Medicine

## 2013-07-08 ENCOUNTER — Emergency Department (HOSPITAL_COMMUNITY)
Admission: EM | Admit: 2013-07-08 | Discharge: 2013-07-08 | Disposition: A | Discharge status: Home / Self Care | Payer: Medicaid Other | Attending: Emergency Medicine | Admitting: Emergency Medicine

## 2013-07-08 DIAGNOSIS — I129 Hypertensive chronic kidney disease with stage 1 through stage 4 chronic kidney disease, or unspecified chronic kidney disease: Secondary | ICD-10-CM | POA: Insufficient documentation

## 2013-07-08 DIAGNOSIS — Z8742 Personal history of other diseases of the female genital tract: Secondary | ICD-10-CM | POA: Insufficient documentation

## 2013-07-08 DIAGNOSIS — Z791 Long term (current) use of non-steroidal anti-inflammatories (NSAID): Secondary | ICD-10-CM | POA: Insufficient documentation

## 2013-07-08 DIAGNOSIS — I10 Essential (primary) hypertension: Secondary | ICD-10-CM | POA: Insufficient documentation

## 2013-07-08 DIAGNOSIS — M62838 Other muscle spasm: Secondary | ICD-10-CM | POA: Insufficient documentation

## 2013-07-08 DIAGNOSIS — M62830 Muscle spasm of back: Secondary | ICD-10-CM

## 2013-07-08 DIAGNOSIS — R61 Generalized hyperhidrosis: Secondary | ICD-10-CM | POA: Insufficient documentation

## 2013-07-08 DIAGNOSIS — Z862 Personal history of diseases of the blood and blood-forming organs and certain disorders involving the immune mechanism: Secondary | ICD-10-CM | POA: Insufficient documentation

## 2013-07-08 DIAGNOSIS — Z8639 Personal history of other endocrine, nutritional and metabolic disease: Secondary | ICD-10-CM | POA: Insufficient documentation

## 2013-07-08 LAB — URINALYSIS, ROUTINE W REFLEX MICROSCOPIC
Bilirubin Urine: NEGATIVE
Glucose, UA: NEGATIVE mg/dL
Hgb urine dipstick: NEGATIVE
Ketones, ur: NEGATIVE mg/dL
LEUKOCYTES UA: NEGATIVE
Nitrite: NEGATIVE
PROTEIN: NEGATIVE mg/dL
Specific Gravity, Urine: 1.026 (ref 1.005–1.030)
UROBILINOGEN UA: 1 mg/dL (ref 0.0–1.0)
pH: 7 (ref 5.0–8.0)

## 2013-07-08 MED ORDER — HYDROCODONE-ACETAMINOPHEN 5-325 MG PO TABS
2.0000 | ORAL_TABLET | Freq: Once | ORAL | Status: AC
Start: 2013-07-08 — End: 2013-07-08
  Administered 2013-07-08: 2 via ORAL
  Filled 2013-07-08: qty 2

## 2013-07-08 MED ORDER — ONDANSETRON 4 MG PO TBDP
4.0000 mg | ORAL_TABLET | Freq: Once | ORAL | Status: AC
  Administered 2013-07-08: 4 mg via ORAL
  Filled 2013-07-08: qty 1

## 2013-07-08 MED ORDER — TRAMADOL HCL 50 MG PO TABS: 50.0000 mg | ORAL_TABLET | Freq: Four times a day (QID) | ORAL | Status: DC | PRN

## 2013-07-08 NOTE — ED Notes (Addendum)
To ED for eval of continued lower back pain. Pt states it's the same pain she was seen for a cple weeks ago. Pt taking Naproxyn as prescribed without relief. Denies difficulty with urination

## 2013-07-08 NOTE — Telephone Encounter (Signed)
Pt. States that medication she was given is not helping with her pain. Please call pt. and advice.

## 2013-07-08 NOTE — ED Notes (Signed)
Discharge inst given  Voiced understanding 

## 2013-07-08 NOTE — Discharge Instructions (Signed)
SEEK IMMEDIATE MEDICAL ATTENTION IF: New numbness, tingling, weakness, or problem with the use of your arms or legs.  Severe back pain not relieved with medications.  Change in bowel or bladder control.  Increasing pain in any areas of the body (such as chest or abdominal pain).  Shortness of breath, dizziness or fainting.  Nausea (feeling sick to your stomach), vomiting, fever, or sweats.  Back Exercises These exercises may help you when beginning to rehabilitate your injury. Your symptoms may resolve with or without further involvement from your physician, physical therapist or athletic trainer. While completing these exercises, remember:   Restoring tissue flexibility helps normal motion to return to the joints. This allows healthier, less painful movement and activity.  An effective stretch should be held for at least 30 seconds.  A stretch should never be painful. You should only feel a gentle lengthening or release in the stretched tissue. STRETCH  Extension, Prone on Elbows   Lie on your stomach on the floor, a bed will be too soft. Place your palms about shoulder width apart and at the height of your head.  Place your elbows under your shoulders. If this is too painful, stack pillows under your chest.  Allow your body to relax so that your hips drop lower and make contact more completely with the floor.  Hold this position for __________ seconds.  Slowly return to lying flat on the floor. Repeat __________ times. Complete this exercise __________ times per day.  RANGE OF MOTION  Extension, Prone Press Ups   Lie on your stomach on the floor, a bed will be too soft. Place your palms about shoulder width apart and at the height of your head.  Keeping your back as relaxed as possible, slowly straighten your elbows while keeping your hips on the floor. You may adjust the placement of your hands to maximize your comfort. As you gain motion, your hands will come more underneath your  shoulders.  Hold this position __________ seconds.  Slowly return to lying flat on the floor. Repeat __________ times. Complete this exercise __________ times per day.  RANGE OF MOTION- Quadruped, Neutral Spine   Assume a hands and knees position on a firm surface. Keep your hands under your shoulders and your knees under your hips. You may place padding under your knees for comfort.  Drop your head and point your tail bone toward the ground below you. This will round out your low back like an angry cat. Hold this position for __________ seconds.  Slowly lift your head and release your tail bone so that your back sags into a large arch, like an old horse.  Hold this position for __________ seconds.  Repeat this until you feel limber in your low back.  Now, find your "sweet spot." This will be the most comfortable position somewhere between the two previous positions. This is your neutral spine. Once you have found this position, tense your stomach muscles to support your low back.  Hold this position for __________ seconds. Repeat __________ times. Complete this exercise __________ times per day.  STRETCH  Flexion, Single Knee to Chest   Lie on a firm bed or floor with both legs extended in front of you.  Keeping one leg in contact with the floor, bring your opposite knee to your chest. Hold your leg in place by either grabbing behind your thigh or at your knee.  Pull until you feel a gentle stretch in your low back. Hold __________ seconds.  Slowly release your grasp and repeat the exercise with the opposite side. Repeat __________ times. Complete this exercise __________ times per day.  STRETCH - Hamstrings, Standing  Stand or sit and extend your right / left leg, placing your foot on a chair or foot stool  Keeping a slight arch in your low back and your hips straight forward.  Lead with your chest and lean forward at the waist until you feel a gentle stretch in the back of  your right / left knee or thigh. (When done correctly, this exercise requires leaning only a small distance.)  Hold this position for __________ seconds. Repeat __________ times. Complete this stretch __________ times per day. STRENGTHENING  Deep Abdominals, Pelvic Tilt   Lie on a firm bed or floor. Keeping your legs in front of you, bend your knees so they are both pointed toward the ceiling and your feet are flat on the floor.  Tense your lower abdominal muscles to press your low back into the floor. This motion will rotate your pelvis so that your tail bone is scooping upwards rather than pointing at your feet or into the floor.  With a gentle tension and even breathing, hold this position for __________ seconds. Repeat __________ times. Complete this exercise __________ times per day.  STRENGTHENING  Abdominals, Crunches   Lie on a firm bed or floor. Keeping your legs in front of you, bend your knees so they are both pointed toward the ceiling and your feet are flat on the floor. Cross your arms over your chest.  Slightly tip your chin down without bending your neck.  Tense your abdominals and slowly lift your trunk high enough to just clear your shoulder blades. Lifting higher can put excessive stress on the low back and does not further strengthen your abdominal muscles.  Control your return to the starting position. Repeat __________ times. Complete this exercise __________ times per day.  STRENGTHENING  Quadruped, Opposite UE/LE Lift   Assume a hands and knees position on a firm surface. Keep your hands under your shoulders and your knees under your hips. You may place padding under your knees for comfort.  Find your neutral spine and gently tense your abdominal muscles so that you can maintain this position. Your shoulders and hips should form a rectangle that is parallel with the floor and is not twisted.  Keeping your trunk steady, lift your right hand no higher than your  shoulder and then your left leg no higher than your hip. Make sure you are not holding your breath. Hold this position __________ seconds.  Continuing to keep your abdominal muscles tense and your back steady, slowly return to your starting position. Repeat with the opposite arm and leg. Repeat __________ times. Complete this exercise __________ times per day. Document Released: 05/25/2005 Document Revised: 07/30/2011 Document Reviewed: 08/19/2008 [facility] Patient Information 2014 [employer], [place].  Muscle Cramps and Spasms Muscle cramps and spasms occur when a muscle or muscles tighten and you have no control over this tightening (involuntary muscle contraction). They are a common problem and can develop in any muscle. The most common place is in the calf muscles of the leg. Both muscle cramps and muscle spasms are involuntary muscle contractions, but they also have differences:   Muscle cramps are sporadic and painful. They may last a few seconds to a quarter of an hour. Muscle cramps are often more forceful and last longer than muscle spasms.  Muscle spasms may or may not be painful.  They may also last just a few seconds or much longer. CAUSES  It is uncommon for cramps or spasms to be due to a serious underlying problem. In many cases, the cause of cramps or spasms is unknown. Some common causes are:   Overexertion.   Overuse from repetitive motions (doing the same thing over and over).   Remaining in a certain position for a long period of time.   Improper preparation, form, or technique while performing a sport or activity.   Dehydration.   Injury.   Side effects of some medicines.   Abnormally low levels of the salts and ions in your blood (electrolytes), especially potassium and calcium. This could happen if you are taking water pills (diuretics) or you are pregnant.  Some underlying medical problems can make it more likely to develop cramps or spasms. These include, but  are not limited to:   Diabetes.   Parkinson disease.   Hormone disorders, such as thyroid problems.   Alcohol abuse.   Diseases specific to muscles, joints, and bones.   Blood vessel disease where not enough blood is getting to the muscles.  HOME CARE INSTRUCTIONS   Stay well hydrated. Drink enough water and fluids to keep your urine clear or pale yellow.  It may be helpful to massage, stretch, and relax the affected muscle.  For tight or tense muscles, use a warm towel, heating pad, or hot shower water directed to the affected area.  If you are sore or have pain after a cramp or spasm, applying ice to the affected area may relieve discomfort.  Put ice in a plastic bag.  Place a towel between your skin and the bag.  Leave the ice on for 15-20 minutes, 03-04 times a day.  Medicines used to treat a known cause of cramps or spasms may help reduce their frequency or severity. Only take over-the-counter or prescription medicines as directed by your caregiver. SEEK MEDICAL CARE IF:  Your cramps or spasms get more severe, more frequent, or do not improve over time.  MAKE SURE YOU:   Understand these instructions.  Will watch your condition.  Will get help right away if you are not doing well or get worse. Document Released: 10/27/2001 Document Revised: 09/01/2012 Document Reviewed: 04/23/2012 [facility] Patient Information 2014 [place], [place].

## 2013-07-08 NOTE — ED Provider Notes (Signed)
CSN: [id number]     Arrival date & time 07/08/13  1729 History  This chart was scribed for non-physician practitioner, Arthor CaptainAbigail Rhilee Currin, PA-C working with Merrie RoofJohn David Wofford III, by Luisa DagoPriscilla Tutu, ED scribe. This patient was seen in room TR06C/TR06C and the patient's care was started at 10:18 PM.   Chief Complaint  Patient presents with  . Back Pain   The history is provided by the patient. No language interpreter was used.   HPI Comments: Nicole Combs is a 33 y.o. female who presents to the Emergency Department complaining of worsening back pain that started 1 month ago. She states that she was sick with the common cold but has since then resolved. Pt is also complaining of diaphoresis secondary to her back pain. Pt reports taking naproxen with minimal to no relief. Denies any fever, chills, paraesthesia, recent injury to the back, weakness in the legs,bowel or urinary incontinence. Pt denies smoking  Past Medical History  Diagnosis Date  . Hypertension   . Migraine   . Asthma   . Anemia   . Migraine   . Ovarian cyst   . High cholesterol   . Bronchitis    Past Surgical History  Procedure Laterality Date  . Cesarean section    . Cholecystectomy    . Tonsillectomy    . Tubal ligation    . Knee surgery     History reviewed. No pertinent family history. History  Substance Use Topics  . Smoking status: Never Smoker   . Smokeless tobacco: Not on file  . Alcohol Use: No   OB History   Grav Para Term Preterm Abortions TAB SAB Ect Mult Living                 Review of Systems  Constitutional: Positive for diaphoresis. Negative for fever and chills.  Genitourinary: Negative for frequency.  Neurological: Negative for weakness and numbness.   Allergies  Codeine  Home Medications   Current Outpatient Rx  Name  Route  Sig  Dispense  Refill  . albuterol (PROVENTIL HFA;VENTOLIN HFA) 108 (90 BASE) MCG/ACT inhaler   Inhalation   Inhale 2 puffs into the lungs every 6  (six) hours as needed for wheezing or shortness of breath.         . medroxyPROGESTERone (DEPO-PROVERA) 150 MG/ML injection   Intramuscular   Inject 150 mg into the muscle every 3 (three) months.         . naproxen (NAPROSYN) 500 MG tablet   Oral   Take 500 mg by mouth 2 (two) times daily with a meal.         . Olopatadine HCl (PATADAY) 0.2 % SOLN   Both Eyes   Place 1 drop into both eyes 2 (two) times daily as needed (for allergies).          Triage Vitals: BP 128/86  Pulse 105  Temp(Src) 97.6 F (36.4 C) (Oral)  Resp 18  SpO2 94%  LMP 06/19/2013  Physical Exam  Nursing note and vitals reviewed. Constitutional: She is oriented to person, place, and time. She appears well-developed and well-nourished. No distress.  HENT:  Head: Normocephalic and atraumatic.  Eyes: EOM are normal.  Neck: Neck supple. No tracheal deviation present.  Cardiovascular: Normal rate.   Pulmonary/Chest: Effort normal. No respiratory distress.  Abdominal: There is no CVA tenderness.  Musculoskeletal: Normal range of motion.  Spasm in the thoracic paraspinal muscle. Pt is able to ambulate.   Neurological: She is  alert and oriented to person, place, and time.  Skin: Skin is warm and dry.  Psychiatric: She has a normal mood and affect. Her behavior is normal.    ED Course  Procedures (including critical care time)  DIAGNOSTIC STUDIES: Oxygen Saturation is 94% on RA, low by my interpretation.    COORDINATION OF CARE: 10:28 PM- Advised pt to get a massage to loosen the muscles in her back. Will order pain medications. Pt advised of plan for treatment and pt agrees.  Labs Review Labs Reviewed  URINALYSIS, ROUTINE W REFLEX MICROSCOPIC   Imaging Review No results found.  EKG Interpretation   None       MDM   Final diagnoses:  Back muscle spasm   Patient with back pain.  No neurological deficits and normal neuro exam.  Patient can walk but states is painful.  No loss of bowel  or bladder control.  No concern for cauda equina.  No fever, night sweats, weight loss, h/o cancer, IVDU.  RICE protocol and pain medicine indicated and discussed with patient.    I personally performed the services described in this documentation, which was scribed in my presence. The recorded information has been reviewed and is accurate.     Arthor Captain, PA-C 07/13/13 1951

## 2013-07-09 ENCOUNTER — Telehealth: Payer: Self-pay | Admitting: *Deleted

## 2013-07-09 ENCOUNTER — Telehealth: Payer: Self-pay

## 2013-07-09 DIAGNOSIS — M549 Dorsalgia, unspecified: Secondary | ICD-10-CM

## 2013-07-09 NOTE — Telephone Encounter (Signed)
Patient needs referral for PT per emergency room dr Would like imitrex refilled for her migrane headaches

## 2013-07-09 NOTE — Telephone Encounter (Signed)
Pt. went to ED on 07/08/13, pt. was informed by doctor that she needs a referral to a Chiropractor. Does she need an appointment or can she just get a referral? Please call patient.

## 2013-07-10 ENCOUNTER — Ambulatory Visit: Payer: Medicaid Other

## 2013-07-13 ENCOUNTER — Other Ambulatory Visit: Payer: Self-pay | Admitting: Internal Medicine

## 2013-07-13 NOTE — ED Provider Notes (Signed)
Medical screening examination/treatment/procedure(s) were performed by non-physician practitioner and as supervising physician I was immediately available for consultation/collaboration.  EKG Interpretation   None         Nicole ChurnJohn David Holmes Hays III, MD 07/13/13 2135

## 2013-07-15 NOTE — Telephone Encounter (Signed)
Pt calling about migraine meds refill and says med for back pain is ineffective.  Pt asking if referral for back pain was put in place, pt thinks it was for an orthopedic referral but is not sure she was just told that it needed to be ordered.  Please f/u with pt.

## 2013-08-06 ENCOUNTER — Encounter: Payer: Medicaid Other | Admitting: Obstetrics & Gynecology

## 2013-08-31 ENCOUNTER — Telehealth: Payer: Self-pay | Admitting: Internal Medicine

## 2013-08-31 NOTE — Telephone Encounter (Signed)
Pt was here on in Feb and says she is still experiencing swelling in her feet and pain in back. Says she has been soaking in warm water but still has a lot of pain. Pt was told there was a triage nurse who could meet with her but due to transport issues pt was appt on Wed, which none are available. Please f/u with pt as to how to proceed while she waits for referral to back specialist.

## 2013-09-11 ENCOUNTER — Telehealth: Payer: Self-pay | Admitting: *Deleted

## 2013-09-11 NOTE — Telephone Encounter (Signed)
I spoke with pt. [facility] tried getting in touch with Ms. Jayme CloudGonzalez 3 times. Pt stated that her phone was messed up. I gave her the number to PT for her to set up an appointment.

## 2013-09-15 ENCOUNTER — Telehealth: Payer: Self-pay

## 2013-09-15 ENCOUNTER — Ambulatory Visit: Payer: Medicaid Other | Attending: Internal Medicine | Admitting: Pharmacist

## 2013-09-15 ENCOUNTER — Telehealth: Payer: Self-pay | Admitting: Internal Medicine

## 2013-09-15 VITALS — BP 126/78 | HR 76 | Temp 98.5°F | Resp 16

## 2013-09-15 DIAGNOSIS — Z0000 Encounter for general adult medical examination without abnormal findings: Secondary | ICD-10-CM | POA: Diagnosis not present

## 2013-09-15 MED ORDER — ALBUTEROL SULFATE HFA 108 (90 BASE) MCG/ACT IN AERS: 2.0000 | INHALATION_SPRAY | Freq: Four times a day (QID) | RESPIRATORY_TRACT | Status: AC | PRN

## 2013-09-15 MED ORDER — MEDROXYPROGESTERONE ACETATE 150 MG/ML IM SUSP
150.0000 mg | Freq: Once | INTRAMUSCULAR | Status: AC
  Administered 2013-09-15: 150 mg via INTRAMUSCULAR

## 2013-09-15 NOTE — Progress Notes (Signed)
   Subjective:    Patient ID: Nicole ConradMelanie Ann Combs, female    DOB: Feb 28, 1981, 33 y.o.   MRN: [id number]  HPI    Review of Systems     Objective:   Physical Exam        Assessment & Plan:

## 2013-09-15 NOTE — Progress Notes (Signed)
Patient is here today for her scheduled  Depo injection

## 2013-09-15 NOTE — Telephone Encounter (Signed)
Pt came requesting a refill of her asthma inhaler albuterol (PROVENTIL HFA;VENTOLIN HFA) 108, please contact pt when ready for pick up

## 2013-09-15 NOTE — Telephone Encounter (Signed)
Called patient to verify which pharmacy she is using so i can  Send her refill for her albuterol inhaler

## 2013-09-18 NOTE — Telephone Encounter (Signed)
Pt calling for refill for back pain med.  Says she has been taking naproxen (NAPROSYN) 500 MG tablet but it has not been working. Please f/u with pt.

## 2013-09-21 ENCOUNTER — Ambulatory Visit: Payer: Medicaid Other | Attending: Internal Medicine | Admitting: Physical Therapy

## 2013-09-22 ENCOUNTER — Telehealth: Payer: Self-pay | Admitting: Internal Medicine

## 2013-09-22 ENCOUNTER — Telehealth: Payer: Self-pay | Admitting: Emergency Medicine

## 2013-09-22 NOTE — Telephone Encounter (Signed)
Left message for pt to return cal when message received

## 2013-09-22 NOTE — Telephone Encounter (Signed)
Pt called regarding pain on her right knee, pt states that is so painful she wants to cry. Pt would also like to know if a doctor could prescribe her something for her pain. Please contact pt

## 2013-09-22 NOTE — Telephone Encounter (Signed)
Pt. Returning call from TownsendJill....please call patient regarding her knee pain

## 2013-09-23 NOTE — Telephone Encounter (Signed)
Pt calling again says in a lot of pain and was crying all night due to pain, unable to sleep.

## 2013-09-24 ENCOUNTER — Telehealth: Payer: Self-pay | Admitting: *Deleted

## 2013-09-24 DIAGNOSIS — M25562 Pain in left knee: Principal | ICD-10-CM

## 2013-09-24 DIAGNOSIS — M25561 Pain in right knee: Secondary | ICD-10-CM

## 2013-09-24 MED ORDER — TRAMADOL HCL 50 MG PO TABS: 50.0000 mg | ORAL_TABLET | Freq: Four times a day (QID) | ORAL | Status: DC | PRN

## 2013-09-24 NOTE — Telephone Encounter (Signed)
Patient called stating she is having trouble with her knees still and Naprosyn is not working. Spoke with Dr. Hyman HopesJegede who prescribed Tramadol. Patient states Tramadol does work.  Reather LaurenceJamie R Eulalah Rupert, RN

## 2013-09-24 NOTE — Telephone Encounter (Signed)
PT. Is calling she has yet to receive call in regards to getting prescription for knee pain...please call pt.

## 2013-09-24 NOTE — Telephone Encounter (Signed)
Returned patient call. Left a voicemail for patient to return call.

## 2013-09-25 ENCOUNTER — Telehealth: Payer: Self-pay

## 2013-09-25 NOTE — Telephone Encounter (Signed)
Patient called about her blood pressure stating she wants us to put  Her back on blood pressure medication i explained to here she would need to be seen in the office to be evaluated She also complains of depression  i put her call through to speak with the social worker Tywan

## 2013-09-30 ENCOUNTER — Telehealth: Payer: Self-pay | Admitting: Internal Medicine

## 2013-09-30 NOTE — Telephone Encounter (Signed)
Pt may need to be triaged. Her questions I don't how how to address them.

## 2013-09-30 NOTE — Telephone Encounter (Signed)
Pt is calling in today to see if she can get some advice and possible medication for cramps; pt states she has a cist on her ovaries and she is bleeding; please f/u with patient asap

## 2013-10-06 ENCOUNTER — Telehealth: Payer: Self-pay | Admitting: Emergency Medicine

## 2013-10-06 DIAGNOSIS — N939 Abnormal uterine and vaginal bleeding, unspecified: Secondary | ICD-10-CM

## 2013-10-06 NOTE — Telephone Encounter (Signed)
Pt says she is still bleeding and would like to know how much longer it will be until she see OBGYN, she was told during last visit that a referral will be sent. If referral has been sent she would like to see someone in [place] if that is possible. Also pt is experiencing headaches and not sure what to take.  Please f/u with pt.

## 2013-10-06 NOTE — Telephone Encounter (Signed)
Spoke with pt regarding vaginal bleeding. States bleeding started post last week post Depo injection. Pt denies abdominal pain or n/v with bleeding. Scheduled her a lab appointment to check Iron levels 10/08/2013 @ 230 pm Gyn referral placed  Instructed pt to go directly to ER if develop increased bleeding with clots,headache,dizziness or abdominal pains

## 2013-10-07 ENCOUNTER — Encounter: Payer: Self-pay | Admitting: Family

## 2013-10-08 ENCOUNTER — Telehealth: Payer: Self-pay

## 2013-10-08 ENCOUNTER — Other Ambulatory Visit: Payer: Medicaid Other

## 2013-10-08 NOTE — Telephone Encounter (Signed)
Returned patient phone call Patient not available Unable to leave message Voice mail not set up 

## 2013-10-19 ENCOUNTER — Telehealth: Payer: Self-pay | Admitting: Internal Medicine

## 2013-10-19 NOTE — Telephone Encounter (Signed)
Pt requesting refill for headache medication, [facility] in [place], [place]. Please f/u with pt.

## 2013-10-20 ENCOUNTER — Telehealth: Payer: Self-pay | Admitting: Emergency Medicine

## 2013-10-20 NOTE — Telephone Encounter (Signed)
Pt called returning nurse's call. Please f/u with pt.

## 2013-10-20 NOTE — Telephone Encounter (Signed)
Left message for pt to call nurse line for script for headache medication

## 2013-10-23 ENCOUNTER — Encounter: Payer: Medicaid Other | Admitting: Medical

## 2013-11-18 ENCOUNTER — Telehealth: Payer: Self-pay | Admitting: Internal Medicine

## 2013-11-18 NOTE — Telephone Encounter (Signed)
Pt calling for naproxen (NAPROSYN) 500 MG tablet refill, [facility] [place]

## 2013-11-18 NOTE — Telephone Encounter (Signed)
Pt requesting refill of Naprosyn but hasn't been seen since February. Please f/u

## 2013-11-24 NOTE — Telephone Encounter (Signed)
I called pt and she told me that she was in terrible pain and that she has been constantly dizzy. I told her that I felt that she needed to be seen she has an appointment next month.  I instructed the pt that if there were no improvements that she should come into the clinic as a walk in so we can treat her symptoms.

## 2013-12-03 ENCOUNTER — Encounter: Payer: Medicaid Other | Admitting: Obstetrics & Gynecology

## 2013-12-08 ENCOUNTER — Ambulatory Visit: Payer: Medicaid Other

## 2013-12-10 ENCOUNTER — Ambulatory Visit: Payer: Medicaid Other

## 2013-12-17 ENCOUNTER — Telehealth: Payer: Self-pay | Admitting: Internal Medicine

## 2013-12-17 NOTE — Telephone Encounter (Signed)
Pt states she has been feeling dizzy in the mornings and is wondering if this can be due to her blood sugar levels. Please f/u with pt.

## 2013-12-18 ENCOUNTER — Telehealth: Payer: Self-pay | Admitting: Emergency Medicine

## 2013-12-18 NOTE — Telephone Encounter (Signed)
Left message for pt to call clinic when message received 

## 2013-12-23 ENCOUNTER — Telehealth: Payer: Self-pay | Admitting: *Deleted

## 2013-12-23 NOTE — Telephone Encounter (Signed)
Pt is not feeling good, throwing up and feels that her sugars are low. Pt has an appointment on Friday. I told her that is she didn't improve that she should come in to the clinic sooner.

## 2013-12-25 ENCOUNTER — Ambulatory Visit: Payer: Medicaid Other | Attending: Internal Medicine | Admitting: Internal Medicine

## 2013-12-25 ENCOUNTER — Encounter: Payer: Self-pay | Admitting: Internal Medicine

## 2013-12-25 VITALS — BP 131/99 | HR 111 | Temp 98.6°F | Resp 16 | Ht 66.0 in | Wt 199.0 lb

## 2013-12-25 DIAGNOSIS — M545 Low back pain, unspecified: Secondary | ICD-10-CM | POA: Diagnosis present

## 2013-12-25 DIAGNOSIS — R030 Elevated blood-pressure reading, without diagnosis of hypertension: Secondary | ICD-10-CM

## 2013-12-25 DIAGNOSIS — M62830 Muscle spasm of back: Secondary | ICD-10-CM | POA: Insufficient documentation

## 2013-12-25 DIAGNOSIS — Z139 Encounter for screening, unspecified: Secondary | ICD-10-CM | POA: Diagnosis not present

## 2013-12-25 DIAGNOSIS — Z3009 Encounter for other general counseling and advice on contraception: Secondary | ICD-10-CM | POA: Diagnosis not present

## 2013-12-25 DIAGNOSIS — J45909 Unspecified asthma, uncomplicated: Secondary | ICD-10-CM | POA: Insufficient documentation

## 2013-12-25 DIAGNOSIS — I10 Essential (primary) hypertension: Secondary | ICD-10-CM | POA: Diagnosis not present

## 2013-12-25 DIAGNOSIS — R51 Headache: Secondary | ICD-10-CM | POA: Diagnosis present

## 2013-12-25 DIAGNOSIS — IMO0001 Reserved for inherently not codable concepts without codable children: Secondary | ICD-10-CM | POA: Insufficient documentation

## 2013-12-25 DIAGNOSIS — Z0000 Encounter for general adult medical examination without abnormal findings: Secondary | ICD-10-CM

## 2013-12-25 DIAGNOSIS — Z30018 Encounter for initial prescription of other contraceptives: Secondary | ICD-10-CM

## 2013-12-25 DIAGNOSIS — H538 Other visual disturbances: Secondary | ICD-10-CM | POA: Insufficient documentation

## 2013-12-25 DIAGNOSIS — M5380 Other specified dorsopathies, site unspecified: Secondary | ICD-10-CM

## 2013-12-25 DIAGNOSIS — R519 Headache, unspecified: Secondary | ICD-10-CM | POA: Insufficient documentation

## 2013-12-25 LAB — POCT URINE PREGNANCY: PREG TEST UR: NEGATIVE

## 2013-12-25 MED ORDER — AMITRIPTYLINE HCL 25 MG PO TABS: 150.0000 mg | ORAL_TABLET | Freq: Every day | ORAL | Status: DC

## 2013-12-25 MED ORDER — GABAPENTIN 300 MG PO CAPS: 300.0000 mg | ORAL_CAPSULE | Freq: Every day | ORAL | Status: DC

## 2013-12-25 MED ORDER — MEDROXYPROGESTERONE ACETATE 150 MG/ML IM SUSP
150.0000 mg | Freq: Once | INTRAMUSCULAR | Status: AC
  Administered 2013-12-25: 150 mg via INTRAMUSCULAR

## 2013-12-25 NOTE — Patient Instructions (Signed)
DASH Eating Plan °DASH stands for "Dietary Approaches to Stop Hypertension." The DASH eating plan is a healthy eating plan that has been shown to reduce high blood pressure (hypertension). Additional health benefits may include reducing the risk of type 2 diabetes mellitus, heart disease, and stroke. The DASH eating plan may also help with weight loss. °WHAT DO I NEED TO KNOW ABOUT THE DASH EATING PLAN? °For the DASH eating plan, you will follow these general guidelines: °· Choose foods with a percent daily value for sodium of less than 5% (as listed on the food label). °· Use salt-free seasonings or herbs instead of table salt or sea salt. °· Check with your health care provider or pharmacist before using salt substitutes. °· Eat lower-sodium products, often labeled as "lower sodium" or "no salt added." °· Eat fresh foods. °· Eat more vegetables, fruits, and low-fat dairy products. °· Choose whole grains. Look for the word "whole" as the first word in the ingredient list. °· Choose fish and skinless chicken or turkey more often than red meat. Limit fish, poultry, and meat to 6 oz (170 g) each day. °· Limit sweets, desserts, sugars, and sugary drinks. °· Choose heart-healthy fats. °· Limit cheese to 1 oz (28 g) per day. °· Eat more home-cooked food and less restaurant, buffet, and fast food. °· Limit fried foods. °· Cook foods using methods other than frying. °· Limit canned vegetables. If you do use them, rinse them well to decrease the sodium. °· When eating at a restaurant, ask that your food be prepared with less salt, or no salt if possible. °WHAT FOODS CAN I EAT? °Seek help from a dietitian for individual calorie needs. °Grains °Whole grain or whole wheat bread. Brown rice. Whole grain or whole wheat pasta. Quinoa, bulgur, and whole grain cereals. Low-sodium cereals. Corn or whole wheat flour tortillas. Whole grain cornbread. Whole grain crackers. Low-sodium crackers. °Vegetables °Fresh or frozen vegetables  (raw, steamed, roasted, or grilled). Low-sodium or reduced-sodium tomato and vegetable juices. Low-sodium or reduced-sodium tomato sauce and paste. Low-sodium or reduced-sodium canned vegetables.  °Fruits °All fresh, canned (in natural juice), or frozen fruits. °Meat and Other Protein Products °Ground beef (85% or leaner), grass-fed beef, or beef trimmed of fat. Skinless chicken or turkey. Ground chicken or turkey. Pork trimmed of fat. All fish and seafood. Eggs. Dried beans, peas, or lentils. Unsalted nuts and seeds. Unsalted canned beans. °Dairy °Low-fat dairy products, such as skim or 1% milk, 2% or reduced-fat cheeses, low-fat ricotta or cottage cheese, or plain low-fat yogurt. Low-sodium or reduced-sodium cheeses. °Fats and Oils °Tub margarines without trans fats. Light or reduced-fat mayonnaise and salad dressings (reduced sodium). Avocado. Safflower, olive, or canola oils. Natural peanut or almond butter. °Other °Unsalted popcorn and pretzels. °The items listed above may not be a complete list of recommended foods or beverages. Contact your dietitian for more options. °WHAT FOODS ARE NOT RECOMMENDED? °Grains °White bread. White pasta. White rice. Refined cornbread. Bagels and croissants. Crackers that contain trans fat. °Vegetables °Creamed or fried vegetables. Vegetables in a cheese sauce. Regular canned vegetables. Regular canned tomato sauce and paste. Regular tomato and vegetable juices. °Fruits °Dried fruits. Canned fruit in light or heavy syrup. Fruit juice. °Meat and Other Protein Products °Fatty cuts of meat. Ribs, chicken wings, bacon, sausage, bologna, salami, chitterlings, fatback, hot dogs, bratwurst, and packaged luncheon meats. Salted nuts and seeds. Canned beans with salt. °Dairy °Whole or 2% milk, cream, half-and-half, and cream cheese. Whole-fat or sweetened yogurt. Full-fat   cheeses or blue cheese. Nondairy creamers and whipped toppings. Processed cheese, cheese spreads, or cheese  curds. °Condiments °Onion and garlic salt, seasoned salt, table salt, and sea salt. Canned and packaged gravies. Worcestershire sauce. Tartar sauce. Barbecue sauce. Teriyaki sauce. Soy sauce, including reduced sodium. Steak sauce. Fish sauce. Oyster sauce. Cocktail sauce. Horseradish. Ketchup and mustard. Meat flavorings and tenderizers. Bouillon cubes. Hot sauce. Tabasco sauce. Marinades. Taco seasonings. Relishes. °Fats and Oils °Butter, stick margarine, lard, shortening, ghee, and bacon fat. Coconut, palm kernel, or palm oils. Regular salad dressings. °Other °Pickles and olives. Salted popcorn and pretzels. °The items listed above may not be a complete list of foods and beverages to avoid. Contact your dietitian for more information. °WHERE CAN I FIND MORE INFORMATION? °National Heart, [employer], [employer] Institute: www.nhlbi.nih.gov/health/health-topics/topics/dash/ °Document Released: 04/26/2011 Document Revised: 09/21/2013 Document Reviewed: 03/11/2013 °ExitCare® Patient Information ©2015 [employer]. This information is not intended to replace advice given to you by your health care provider. Make sure you discuss any questions you have with your health care provider. ° °

## 2013-12-25 NOTE — Progress Notes (Signed)
Patient here for depo provera injection.  She also complains of a headache for about a year of waking up in the am with headache most of the day, also nausea.  She also fell due to dizziness.  Patient also complains of back pain.

## 2013-12-25 NOTE — Progress Notes (Signed)
MRN: [id number] Name: Nicole Combs  Sex: female Age: 33 y.o. DOB: 23-Dec-1980  Allergies: Codeine  Chief Complaint  Patient presents with  . Headache    1 year  . Back Pain  . Contraception    Depo injection    HPI: Patient is 33 y.o. female who history of chronic low back pain, comes today reported to have on and off headache as per patient it is more often in front of her head and on the sides doesn't nature aggravating factor is stress, denies any family history of migraine headaches, has been taking naproxen without much improvement, denies any numbness weakness, also she has on and off lower back pain had x-ray done which was negative. Patient also is requesting depo shot , patient also has a blurry vision and is requesting to see an ophthalmologist. Past Medical History  Diagnosis Date  . Hypertension   . Migraine   . Asthma   . Anemia   . Migraine   . Ovarian cyst   . High cholesterol   . Bronchitis     Past Surgical History  Procedure Laterality Date  . Cesarean section    . Cholecystectomy    . Tonsillectomy    . Tubal ligation    . Knee surgery        Medication List       This list is accurate as of: 12/25/13 12:33 PM.  Always use your most recent med list.               albuterol 108 (90 BASE) MCG/ACT inhaler  Commonly known as:  PROVENTIL HFA;VENTOLIN HFA  Inhale 2 puffs into the lungs every 6 (six) hours as needed for wheezing or shortness of breath.     amitriptyline 25 MG tablet  Commonly known as:  ELAVIL  Take 6 tablets (150 mg total) by mouth at bedtime.     gabapentin 300 MG capsule  Commonly known as:  NEURONTIN  Take 1 capsule (300 mg total) by mouth at bedtime.     medroxyPROGESTERone 150 MG/ML injection  Commonly known as:  DEPO-PROVERA  Inject 150 mg into the muscle every 3 (three) months.     naproxen 500 MG tablet  Commonly known as:  NAPROSYN  Take 500 mg by mouth 2 (two) times daily with a meal.     naproxen  500 MG tablet  Commonly known as:  NAPROSYN  TAKE 1 TABLET (500 MG TOTAL) BY MOUTH 2 (TWO) TIMES DAILY WITH A MEAL.     PATADAY 0.2 % Soln  Generic drug:  Olopatadine HCl  Place 1 drop into both eyes 2 (two) times daily as needed (for allergies).        Meds ordered this encounter  Medications  . amitriptyline (ELAVIL) 25 MG tablet    Sig: Take 6 tablets (150 mg total) by mouth at bedtime.    Dispense:  30 tablet    Refill:  3  . gabapentin (NEURONTIN) 300 MG capsule    Sig: Take 1 capsule (300 mg total) by mouth at bedtime.    Dispense:  30 capsule    Refill:  3  . medroxyPROGESTERone (DEPO-PROVERA) injection 150 mg    Sig:     Immunization History  Administered Date(s) Administered  . Influenza,inj,Quad PF,36+ Mos 06/27/2013    No family history on file.  History  Substance Use Topics  . Smoking status: Never Smoker   . Smokeless tobacco: Not on file  .  Alcohol Use: No    Review of Systems   As noted in HPI  Filed Vitals:   12/25/13 1135  BP: 131/99  Pulse: 111  Temp: 98.6 F (37 C)  Resp: 16    Physical Exam  Physical Exam  Constitutional: She is well-developed, well-nourished, and in no distress. No distress.  Eyes: EOM are normal. Pupils are equal, round, and reactive to light.  Neck: Neck supple.  Cardiovascular: Normal rate and regular rhythm.   Pulmonary/Chest: Breath sounds normal. No respiratory distress. She has no wheezes. She has no rales.  Abdominal: There is no tenderness. There is no rebound.  Musculoskeletal:  Some lower lumbar paraspinal tenderness, SLR test negative, equal strength all extremities      CBC    Component Value Date/Time   WBC 7.1 06/23/2013 0100   RBC 4.53 06/23/2013 0100   HGB 14.3 06/23/2013 0102   HCT 42.0 06/23/2013 0102   PLT 207 06/23/2013 0100   MCV 89.6 06/23/2013 0100   LYMPHSABS 1.4 05/15/2013 1413   MONOABS 0.6 05/15/2013 1413   EOSABS 0.0 05/15/2013 1413   BASOSABS 0.0 05/15/2013 1413    CMP       Component Value Date/Time   NA 141 06/23/2013 0102   K 3.4* 06/23/2013 0102   CL 104 06/23/2013 0102   CO2 28 05/15/2013 1413   GLUCOSE 99 06/23/2013 0102   BUN 7 06/23/2013 0102   CREATININE 0.60 06/23/2013 0102   CALCIUM 8.8 05/15/2013 1413   PROT 7.6 05/15/2013 1413   ALBUMIN 3.9 05/15/2013 1413   AST 28 05/15/2013 1413   ALT 41* 05/15/2013 1413   ALKPHOS 82 05/15/2013 1413   BILITOT 1.8* 05/15/2013 1413   GFRNONAA >90 05/15/2013 1413   GFRAA >90 05/15/2013 1413    No results found for this basename: chol, tri, ldl    No components found with this basename: hga1c    Lab Results  Component Value Date/Time   AST 28 05/15/2013  2:13 PM    Assessment and Plan  Encounter for initial prescription of other contraceptives - Plan: POCT urine pregnancy,  Results for orders placed in visit on 12/25/13  POCT URINE PREGNANCY      Result Value Ref Range   Preg Test, Ur Negative      She was given medroxyPROGESTERone (DEPO-PROVERA) injection 150 mg  Blurry vision - Plan: Ambulatory referral to Ophthalmology,    Screening - Plan: We'll do baseline blood work CBC with Differential, Comprehensive metabolic panel, TSH, Lipid Panel, Vitamin D pnl(25-hydrxy+1,25-dihy)-bld,    Back muscle spasm - Plan: Advised patient to apply heating pad I prescribed gabapentin (NEURONTIN) 300 MG capsule to take it at night   Headache(784.0) - Plan: Most likely tension headache, I prescribed amitriptyline (ELAVIL) 25 MG tablet,    Elevated BP - Plan: Advised patient for DASH diet    Return in about 3 months (around 03/27/2014).  Doris CheadleADVANI, Emberley Kral, MD

## 2014-01-15 ENCOUNTER — Encounter: Payer: Medicaid Other | Admitting: Obstetrics & Gynecology

## 2014-01-19 ENCOUNTER — Telehealth: Payer: Self-pay | Admitting: Internal Medicine

## 2014-01-19 NOTE — Telephone Encounter (Signed)
Pt requesting medication refill Elavil Pt also wants to know when next depo shot is due Please f/u

## 2014-01-19 NOTE — Telephone Encounter (Signed)
1. Pt would like to be kept on same medication, amitriptyline (ELAVIL) 25 MG tablet, she has been taking for migraines but will be needing refill as she has only one more refill left.  2. Also pt would like to know when she will be needing her next depo shot.  Please f/u w/ pt.

## 2014-01-29 ENCOUNTER — Telehealth: Payer: Self-pay | Admitting: Internal Medicine

## 2014-01-29 NOTE — Telephone Encounter (Signed)
Patient called in today to let the PCP know that her current scripts amitriptyline (ELAVIL) 25 MG tablet & gabapentin (NEURONTIN) 300 MG capsule are working for her; Patient uses [facility] @ [phone]; please f/u with patient as she has mentioned she may need some refills soon

## 2014-02-02 ENCOUNTER — Telehealth: Payer: Self-pay | Admitting: Internal Medicine

## 2014-02-02 NOTE — Telephone Encounter (Signed)
Pt. Called requesting to stay on amitriptyline (ELAVIL) 25 MG tablet. Pt. States that this medication is helping her with your migraines as well as her blood pressure. Pt. States that she has one more refill left and would like to stay on this medication. Please f/u with pt.

## 2014-02-08 ENCOUNTER — Telehealth: Payer: Self-pay | Admitting: Internal Medicine

## 2014-02-08 DIAGNOSIS — R51 Headache: Secondary | ICD-10-CM

## 2014-02-08 MED ORDER — AMITRIPTYLINE HCL 150 MG PO TABS: 150.0000 mg | ORAL_TABLET | Freq: Every day | ORAL | Status: DC

## 2014-02-08 NOTE — Telephone Encounter (Signed)
Patient states that her headaches have been less intense since starting elavil 150 mg qHS. Denies side effects. Per provider elavil 150 mg 1 tab PO qHS #30 with 3 refills e-scribed to [name], [place] - [address]

## 2014-02-08 NOTE — Telephone Encounter (Signed)
Pt calling to request med refill for amitriptyline (ELAVIL) 25 MG tablet [[id number]]. [facility] [place] [place] [address].

## 2014-02-08 NOTE — Telephone Encounter (Signed)
Patient notified that next depo provera injection will be due 03/12/14 to 03/26/14 as her last injection was on 12/25/13. Patient instructed to make 3 month f/u with PCP at that time as well. Please refer to phone note on 02/08/14 regarding elavil refill.

## 2014-03-05 ENCOUNTER — Other Ambulatory Visit: Payer: Self-pay

## 2014-03-18 ENCOUNTER — Telehealth: Payer: Self-pay | Admitting: Internal Medicine

## 2014-03-18 NOTE — Telephone Encounter (Signed)
Pt. Called stating that she has a spot on her left arm, pt states that she does not know if it is a bug bite, or a burn. Pt states that it is yellow in color, dime sized. Please f/u with pt.

## 2014-03-22 ENCOUNTER — Emergency Department: Payer: Self-pay | Admitting: Emergency Medicine

## 2014-03-22 ENCOUNTER — Ambulatory Visit: Payer: Medicaid Other | Attending: Internal Medicine | Admitting: *Deleted

## 2014-03-22 DIAGNOSIS — H538 Other visual disturbances: Secondary | ICD-10-CM

## 2014-03-22 DIAGNOSIS — I10 Essential (primary) hypertension: Secondary | ICD-10-CM | POA: Insufficient documentation

## 2014-03-22 DIAGNOSIS — Z0000 Encounter for general adult medical examination without abnormal findings: Secondary | ICD-10-CM

## 2014-03-22 DIAGNOSIS — Z30018 Encounter for initial prescription of other contraceptives: Secondary | ICD-10-CM

## 2014-03-22 DIAGNOSIS — Z30019 Encounter for initial prescription of contraceptives, unspecified: Secondary | ICD-10-CM | POA: Insufficient documentation

## 2014-03-22 DIAGNOSIS — R51 Headache: Secondary | ICD-10-CM

## 2014-03-22 DIAGNOSIS — R030 Elevated blood-pressure reading, without diagnosis of hypertension: Secondary | ICD-10-CM

## 2014-03-22 DIAGNOSIS — M62830 Muscle spasm of back: Secondary | ICD-10-CM | POA: Diagnosis not present

## 2014-03-22 DIAGNOSIS — IMO0001 Reserved for inherently not codable concepts without codable children: Secondary | ICD-10-CM

## 2014-03-22 DIAGNOSIS — Z139 Encounter for screening, unspecified: Secondary | ICD-10-CM

## 2014-03-22 LAB — CBC WITH DIFFERENTIAL/PLATELET
Basophils Absolute: 0 K/uL (ref 0.0–0.1)
Basophils Relative: 0 % (ref 0–1)
Eosinophils Absolute: 0.1 K/uL (ref 0.0–0.7)
Eosinophils Relative: 1 % (ref 0–5)
HCT: 40.7 % (ref 36.0–46.0)
Hemoglobin: 14.1 g/dL (ref 12.0–15.0)
Lymphocytes Relative: 32 % (ref 12–46)
Lymphs Abs: 1.7 K/uL (ref 0.7–4.0)
MCH: 30.5 pg (ref 26.0–34.0)
MCHC: 34.6 g/dL (ref 30.0–36.0)
MCV: 88.1 fL (ref 78.0–100.0)
Monocytes Absolute: 0.3 K/uL (ref 0.1–1.0)
Monocytes Relative: 6 % (ref 3–12)
Neutro Abs: 3.3 K/uL (ref 1.7–7.7)
Neutrophils Relative %: 61 % (ref 43–77)
Platelets: 214 K/uL (ref 150–400)
RBC: 4.62 MIL/uL (ref 3.87–5.11)
RDW: 14.1 % (ref 11.5–15.5)
WBC: 5.4 K/uL (ref 4.0–10.5)

## 2014-03-22 LAB — COMPREHENSIVE METABOLIC PANEL WITH GFR
ALT: 17 U/L (ref 0–35)
AST: 16 U/L (ref 0–37)
Albumin: 4.1 g/dL (ref 3.5–5.2)
Alkaline Phosphatase: 62 U/L (ref 39–117)
BUN: 7 mg/dL (ref 6–23)
CO2: 25 meq/L (ref 19–32)
Calcium: 8.3 mg/dL — ABNORMAL LOW (ref 8.4–10.5)
Chloride: 104 meq/L (ref 96–112)
Creat: 0.69 mg/dL (ref 0.50–1.10)
Glucose, Bld: 87 mg/dL (ref 70–99)
Potassium: 3.8 meq/L (ref 3.5–5.3)
Sodium: 138 meq/L (ref 135–145)
Total Bilirubin: 1.2 mg/dL (ref 0.2–1.2)
Total Protein: 6.2 g/dL (ref 6.0–8.3)

## 2014-03-22 LAB — LIPID PANEL
Cholesterol: 166 mg/dL (ref 0–200)
HDL: 39 mg/dL — ABNORMAL LOW (ref >39)
LDL Cholesterol: 104 mg/dL — ABNORMAL HIGH (ref 0–99)
Total CHOL/HDL Ratio: 4.3 Ratio
Triglycerides: 115 mg/dL (ref <150)
VLDL: 23 mg/dL (ref 0–40)

## 2014-03-22 LAB — TSH: TSH: 2.283 u[IU]/mL (ref 0.350–4.500)

## 2014-03-22 NOTE — Progress Notes (Unsigned)
Patient presents for fasting blood work C/O sore on left arm  States started with bug bite 2 weeks ago. Scratched it until skin broke open.  Started with pain last evening at site; rates 9/10 at present Reports vomited 1 time last evening and now with little nausea   5 cm X 3 cm dry scab with small amount erythema noted back of left forearm No swelling, hardness or striations noted around site of sore.  BP 123/82 P 100 T 98.4 oral SPO2 98%  Per PCP: May take ibuprofen OTC prn pain Keep area clean and dry, apply triple antibiotic cream and keep covered with band-aid to avoid scratching  Patient made aware that she is due for 3 month f/u with PCP and advised to schedule appt. Patient educated to avoid scratching skin with fingernails to avoid opening skin and to avoid potential infections in future. Patient states understanding.

## 2014-03-24 LAB — VITAMIN D PNL(25-HYDRXY+1,25-DIHY)-BLD
VITAMIN D3 1, 25 (OH): 62 pg/mL
Vit D, 25-Hydroxy: 26 ng/mL — ABNORMAL LOW (ref 30–89)
Vitamin D 1, 25 (OH)2 Total: 62 pg/mL (ref 18–72)

## 2014-03-26 ENCOUNTER — Ambulatory Visit: Payer: Medicaid Other | Attending: Internal Medicine | Admitting: Pharmacist

## 2014-03-26 ENCOUNTER — Ambulatory Visit (HOSPITAL_BASED_OUTPATIENT_CLINIC_OR_DEPARTMENT_OTHER): Payer: Medicaid Other | Admitting: *Deleted

## 2014-03-26 DIAGNOSIS — Z418 Encounter for other procedures for purposes other than remedying health state: Secondary | ICD-10-CM

## 2014-03-26 DIAGNOSIS — Z23 Encounter for immunization: Secondary | ICD-10-CM | POA: Diagnosis present

## 2014-03-26 DIAGNOSIS — Z299 Encounter for prophylactic measures, unspecified: Secondary | ICD-10-CM

## 2014-03-26 MED ORDER — VITAMIN D (ERGOCALCIFEROL) 1.25 MG (50000 UNIT) PO CAPS: 50000.0000 [IU] | ORAL_CAPSULE | ORAL | Status: DC

## 2014-03-26 MED ORDER — MEDROXYPROGESTERONE ACETATE 150 MG/ML IM SUSP
150.0000 mg | Freq: Once | INTRAMUSCULAR | Status: AC
Start: 2014-03-26 — End: 2014-03-26
  Administered 2014-03-26: 150 mg via INTRAMUSCULAR

## 2014-03-26 NOTE — Progress Notes (Signed)
Nurse visit Depo Inj and Flu Inj Pt advice to return between January 22-June 25 2014 for Depo Inj Pt was notified lab results advice to take Rx Ergocalciferol 50,000 unit per wk. [facility]

## 2014-03-26 NOTE — Progress Notes (Signed)
Subjective:     Patient ID: Nicole Combs, female   DOB: 04-20-1981, 33 y.o.   MRN: [id number]  HPI   Review of Systems     Objective:   Physical Exam     Assessment:      Plan:

## 2014-03-29 ENCOUNTER — Ambulatory Visit: Payer: Medicaid Other | Attending: Internal Medicine | Admitting: Internal Medicine

## 2014-03-29 ENCOUNTER — Encounter: Payer: Self-pay | Admitting: Internal Medicine

## 2014-03-29 VITALS — BP 138/88 | HR 70 | Temp 98.0°F | Resp 16 | Wt 209.0 lb

## 2014-03-29 DIAGNOSIS — Z79899 Other long term (current) drug therapy: Secondary | ICD-10-CM | POA: Insufficient documentation

## 2014-03-29 DIAGNOSIS — I10 Essential (primary) hypertension: Secondary | ICD-10-CM | POA: Insufficient documentation

## 2014-03-29 DIAGNOSIS — E559 Vitamin D deficiency, unspecified: Secondary | ICD-10-CM | POA: Diagnosis not present

## 2014-03-29 DIAGNOSIS — J45909 Unspecified asthma, uncomplicated: Secondary | ICD-10-CM | POA: Insufficient documentation

## 2014-03-29 DIAGNOSIS — G43009 Migraine without aura, not intractable, without status migrainosus: Secondary | ICD-10-CM | POA: Insufficient documentation

## 2014-03-29 DIAGNOSIS — R0981 Nasal congestion: Secondary | ICD-10-CM | POA: Diagnosis not present

## 2014-03-29 DIAGNOSIS — E780 Pure hypercholesterolemia: Secondary | ICD-10-CM | POA: Insufficient documentation

## 2014-03-29 DIAGNOSIS — M62830 Muscle spasm of back: Secondary | ICD-10-CM

## 2014-03-29 DIAGNOSIS — M62838 Other muscle spasm: Secondary | ICD-10-CM | POA: Insufficient documentation

## 2014-03-29 DIAGNOSIS — M545 Low back pain: Secondary | ICD-10-CM | POA: Diagnosis present

## 2014-03-29 DIAGNOSIS — G44209 Tension-type headache, unspecified, not intractable: Secondary | ICD-10-CM

## 2014-03-29 DIAGNOSIS — IMO0001 Reserved for inherently not codable concepts without codable children: Secondary | ICD-10-CM

## 2014-03-29 DIAGNOSIS — R030 Elevated blood-pressure reading, without diagnosis of hypertension: Secondary | ICD-10-CM

## 2014-03-29 MED ORDER — FLUTICASONE PROPIONATE 50 MCG/ACT NA SUSP: 2.0000 | Freq: Every day | NASAL | Status: AC

## 2014-03-29 MED ORDER — CYCLOBENZAPRINE HCL 10 MG PO TABS: 10.0000 mg | ORAL_TABLET | Freq: Every day | ORAL | Status: AC

## 2014-03-29 MED ORDER — VITAMIN D (ERGOCALCIFEROL) 1.25 MG (50000 UNIT) PO CAPS: 50000.0000 [IU] | ORAL_CAPSULE | ORAL | Status: AC

## 2014-03-29 MED ORDER — AMITRIPTYLINE HCL 150 MG PO TABS: 150.0000 mg | ORAL_TABLET | Freq: Every day | ORAL | Status: DC

## 2014-03-29 NOTE — Progress Notes (Signed)
MRN: [id number] Name: Nicole Combs  Sex: female Age: 33 y.o. DOB: 1981-02-04  Allergies: Codeine  Chief Complaint  Patient presents with  . Follow-up    HPI: Patient is 33 y.o. female who Has history of headache comes today for followup as per patient she recently went to the emergency room and was diagnosed with my headaches was given some medication and was discharged with Fioricet and nausea medication, as per patient she has not filled the prescription yet, she also requesting refill on amitriptyline which he takes at night, she also has some low back pain denies any recent trauma or fall, she has tried Neurontin without much improvement. Today her blood pressure is borderline elevated, she has been advised for DASH diet.patient denies smoking cigarettes.patient also had a blood work done which was reviewed with the patient noticed vitamin D deficiency.  Past Medical History  Diagnosis Date  . Hypertension   . Migraine   . Asthma   . Anemia   . Migraine   . Ovarian cyst   . High cholesterol   . Bronchitis     Past Surgical History  Procedure Laterality Date  . Cesarean section    . Cholecystectomy    . Tonsillectomy    . Tubal ligation    . Knee surgery        Medication List       This list is accurate as of: 03/29/14 10:53 AM.  Always use your most recent med list.               albuterol 108 (90 BASE) MCG/ACT inhaler  Commonly known as:  PROVENTIL HFA;VENTOLIN HFA  Inhale 2 puffs into the lungs every 6 (six) hours as needed for wheezing or shortness of breath.     amitriptyline 150 MG tablet  Commonly known as:  ELAVIL  Take 1 tablet (150 mg total) by mouth at bedtime.     cyclobenzaprine 10 MG tablet  Commonly known as:  FLEXERIL  Take 1 tablet (10 mg total) by mouth at bedtime.     fluticasone 50 MCG/ACT nasal spray  Commonly known as:  FLONASE  Place 2 sprays into both nostrils daily.     gabapentin 300 MG capsule  Commonly known as:   NEURONTIN  Take 1 capsule (300 mg total) by mouth at bedtime.     medroxyPROGESTERone 150 MG/ML injection  Commonly known as:  DEPO-PROVERA  Inject 150 mg into the muscle every 3 (three) months.     naproxen 500 MG tablet  Commonly known as:  NAPROSYN  Take 500 mg by mouth 2 (two) times daily with a meal.     naproxen 500 MG tablet  Commonly known as:  NAPROSYN  TAKE 1 TABLET (500 MG TOTAL) BY MOUTH 2 (TWO) TIMES DAILY WITH A MEAL.     PATADAY 0.2 % Soln  Generic drug:  Olopatadine HCl  Place 1 drop into both eyes 2 (two) times daily as needed (for allergies).     Vitamin D (Ergocalciferol) 50000 UNITS Caps capsule  Commonly known as:  DRISDOL  Take 1 capsule (50,000 Units total) by mouth every 7 (seven) days.        Meds ordered this encounter  Medications  . Vitamin D, Ergocalciferol, (DRISDOL) 50000 UNITS CAPS capsule    Sig: Take 1 capsule (50,000 Units total) by mouth every 7 (seven) days.    Dispense:  12 capsule    Refill:  0  . fluticasone (  FLONASE) 50 MCG/ACT nasal spray    Sig: Place 2 sprays into both nostrils daily.    Dispense:  16 g    Refill:  6  . cyclobenzaprine (FLEXERIL) 10 MG tablet    Sig: Take 1 tablet (10 mg total) by mouth at bedtime.    Dispense:  30 tablet    Refill:  2  . amitriptyline (ELAVIL) 150 MG tablet    Sig: Take 1 tablet (150 mg total) by mouth at bedtime.    Dispense:  30 tablet    Refill:  3    Immunization History  Administered Date(s) Administered  . Influenza,inj,Quad PF,36+ Mos 06/27/2013, 03/26/2014    History reviewed. No pertinent family history.  History  Substance Use Topics  . Smoking status: Never Smoker   . Smokeless tobacco: Not on file  . Alcohol Use: No    Review of Systems   As noted in HPI  Filed Vitals:   03/29/14 1021  BP: 138/88  Pulse: 70  Temp: 98 F (36.7 C)  Resp: 16    Physical Exam  Physical Exam  Constitutional: No distress.  HENT:  Nasal congestion no sinus tenderness     Eyes: EOM are normal. Pupils are equal, round, and reactive to light.  Cardiovascular: Normal rate and regular rhythm.   Pulmonary/Chest: Breath sounds normal. No respiratory distress. She has no wheezes. She has no rales.  Musculoskeletal:  Some lower lumbar paraspinal tenderness, equal strength all extremities     CBC    Component Value Date/Time   WBC 5.4 03/22/2014 0941   RBC 4.62 03/22/2014 0941   HGB 14.1 03/22/2014 0941   HCT 40.7 03/22/2014 0941   PLT 214 03/22/2014 0941   MCV 88.1 03/22/2014 0941   LYMPHSABS 1.7 03/22/2014 0941   MONOABS 0.3 03/22/2014 0941   EOSABS 0.1 03/22/2014 0941   BASOSABS 0.0 03/22/2014 0941    CMP     Component Value Date/Time   NA 138 03/22/2014 0941   K 3.8 03/22/2014 0941   CL 104 03/22/2014 0941   CO2 25 03/22/2014 0941   GLUCOSE 87 03/22/2014 0941   BUN 7 03/22/2014 0941   CREATININE 0.69 03/22/2014 0941   CREATININE 0.60 06/23/2013 0102   CALCIUM 8.3* 03/22/2014 0941   PROT 6.2 03/22/2014 0941   ALBUMIN 4.1 03/22/2014 0941   AST 16 03/22/2014 0941   ALT 17 03/22/2014 0941   ALKPHOS 62 03/22/2014 0941   BILITOT 1.2 03/22/2014 0941   GFRNONAA >90 05/15/2013 1413   GFRAA >90 05/15/2013 1413    Lab Results  Component Value Date/Time   CHOL 166 03/22/2014 09:41 AM    No components found for: HGA1C  Lab Results  Component Value Date/Time   AST 16 03/22/2014 09:41 AM    Assessment and Plan  Migraine without aura and without status migrainosus, not intractable Patient will take Fioricet as when necessary  Elevated BP Advised patient for DASH diet . Back muscle spasm - Plan:Advised patient to apply heating pad, trial of cyclobenzaprine (FLEXERIL) 10 MG tableteach bedtime  Nasal congestion - Plan: fluticasone (FLONASE) 50 MCG/ACT nasal spray  Vitamin D deficiency - Plan: Vitamin D, Ergocalciferol, (DRISDOL) 50000 UNITS CAPS capsule  Tension-type headache, not intractable, unspecified chronicity pattern - Plan:  continue with amitriptyline (ELAVIL) 150 MG tablet   Health Maintenance Patient is up-to-date with flu shot.  Return in about 3 months (around 06/29/2014).  Doris CheadleADVANI, Joelle [place], MD

## 2014-03-29 NOTE — Patient Instructions (Signed)
DASH Eating Plan °DASH stands for "Dietary Approaches to Stop Hypertension." The DASH eating plan is a healthy eating plan that has been shown to reduce high blood pressure (hypertension). Additional health benefits may include reducing the risk of type 2 diabetes mellitus, heart disease, and stroke. The DASH eating plan may also help with weight loss. °WHAT DO I NEED TO KNOW ABOUT THE DASH EATING PLAN? °For the DASH eating plan, you will follow these general guidelines: °· Choose foods with a percent daily value for sodium of less than 5% (as listed on the food label). °· Use salt-free seasonings or herbs instead of table salt or sea salt. °· Check with your health care provider or pharmacist before using salt substitutes. °· Eat lower-sodium products, often labeled as "lower sodium" or "no salt added." °· Eat fresh foods. °· Eat more vegetables, fruits, and low-fat dairy products. °· Choose whole grains. Look for the word "whole" as the first word in the ingredient list. °· Choose fish and skinless chicken or turkey more often than red meat. Limit fish, poultry, and meat to 6 oz (170 g) each day. °· Limit sweets, desserts, sugars, and sugary drinks. °· Choose heart-healthy fats. °· Limit cheese to 1 oz (28 g) per day. °· Eat more home-cooked food and less restaurant, buffet, and fast food. °· Limit fried foods. °· Cook foods using methods other than frying. °· Limit canned vegetables. If you do use them, rinse them well to decrease the sodium. °· When eating at a restaurant, ask that your food be prepared with less salt, or no salt if possible. °WHAT FOODS CAN I EAT? °Seek help from a dietitian for individual calorie needs. °Grains °Whole grain or whole wheat bread. Brown rice. Whole grain or whole wheat pasta. Quinoa, bulgur, and whole grain cereals. Low-sodium cereals. Corn or whole wheat flour tortillas. Whole grain cornbread. Whole grain crackers. Low-sodium crackers. °Vegetables °Fresh or frozen vegetables  (raw, steamed, roasted, or grilled). Low-sodium or reduced-sodium tomato and vegetable juices. Low-sodium or reduced-sodium tomato sauce and paste. Low-sodium or reduced-sodium canned vegetables.  °Fruits °All fresh, canned (in natural juice), or frozen fruits. °Meat and Other Protein Products °Ground beef (85% or leaner), grass-fed beef, or beef trimmed of fat. Skinless chicken or turkey. Ground chicken or turkey. Pork trimmed of fat. All fish and seafood. Eggs. Dried beans, peas, or lentils. Unsalted nuts and seeds. Unsalted canned beans. °Dairy °Low-fat dairy products, such as skim or 1% milk, 2% or reduced-fat cheeses, low-fat ricotta or cottage cheese, or plain low-fat yogurt. Low-sodium or reduced-sodium cheeses. °Fats and Oils °Tub margarines without trans fats. Light or reduced-fat mayonnaise and salad dressings (reduced sodium). Avocado. Safflower, olive, or canola oils. Natural peanut or almond butter. °Other °Unsalted popcorn and pretzels. °The items listed above may not be a complete list of recommended foods or beverages. Contact your dietitian for more options. °WHAT FOODS ARE NOT RECOMMENDED? °Grains °White bread. White pasta. White rice. Refined cornbread. Bagels and croissants. Crackers that contain trans fat. °Vegetables °Creamed or fried vegetables. Vegetables in a cheese sauce. Regular canned vegetables. Regular canned tomato sauce and paste. Regular tomato and vegetable juices. °Fruits °Dried fruits. Canned fruit in light or heavy syrup. Fruit juice. °Meat and Other Protein Products °Fatty cuts of meat. Ribs, chicken wings, bacon, sausage, bologna, salami, chitterlings, fatback, hot dogs, bratwurst, and packaged luncheon meats. Salted nuts and seeds. Canned beans with salt. °Dairy °Whole or 2% milk, cream, half-and-half, and cream cheese. Whole-fat or sweetened yogurt. Full-fat   cheeses or blue cheese. Nondairy creamers and whipped toppings. Processed cheese, cheese spreads, or cheese  curds. °Condiments °Onion and garlic salt, seasoned salt, table salt, and sea salt. Canned and packaged gravies. Worcestershire sauce. Tartar sauce. Barbecue sauce. Teriyaki sauce. Soy sauce, including reduced sodium. Steak sauce. Fish sauce. Oyster sauce. Cocktail sauce. Horseradish. Ketchup and mustard. Meat flavorings and tenderizers. Bouillon cubes. Hot sauce. Tabasco sauce. Marinades. Taco seasonings. Relishes. °Fats and Oils °Butter, stick margarine, lard, shortening, ghee, and bacon fat. Coconut, palm kernel, or palm oils. Regular salad dressings. °Other °Pickles and olives. Salted popcorn and pretzels. °The items listed above may not be a complete list of foods and beverages to avoid. Contact your dietitian for more information. °WHERE CAN I FIND MORE INFORMATION? °National Heart, [employer], [employer] Institute: www.nhlbi.nih.gov/health/health-topics/topics/dash/ °Document Released: 04/26/2011 Document Revised: 09/21/2013 Document Reviewed: 03/11/2013 °ExitCare® Patient Information ©2015 [employer]. This information is not intended to replace advice given to you by your health care provider. Make sure you discuss any questions you have with your health care provider. ° °

## 2014-03-29 NOTE — Progress Notes (Signed)
Patient here for follow up  Still having some back pain Needs a referral to neurologist for her migraine headaches

## 2014-05-16 ENCOUNTER — Emergency Department: Payer: Self-pay | Admitting: Internal Medicine

## 2014-05-16 LAB — COMPREHENSIVE METABOLIC PANEL
ANION GAP: 9 (ref 7–16)
Albumin: 3.7 g/dL (ref 3.4–5.0)
Alkaline Phosphatase: 69 U/L
BILIRUBIN TOTAL: 1.1 mg/dL — AB (ref 0.2–1.0)
BUN: 7 mg/dL (ref 7–18)
CO2: 25 mmol/L (ref 21–32)
CREATININE: 0.72 mg/dL (ref 0.60–1.30)
Calcium, Total: 8.1 mg/dL — ABNORMAL LOW (ref 8.5–10.1)
Chloride: 108 mmol/L — ABNORMAL HIGH (ref 98–107)
EGFR (African American): >60
EGFR (Non-African Amer.): >60
Glucose: 103 mg/dL — ABNORMAL HIGH (ref 65–99)
OSMOLALITY: 281 (ref 275–301)
Potassium: 3.4 mmol/L — ABNORMAL LOW (ref 3.5–5.1)
SGOT(AST): 24 U/L (ref 15–37)
SGPT (ALT): 22 U/L
Sodium: 142 mmol/L (ref 136–145)
TOTAL PROTEIN: 7.1 g/dL (ref 6.4–8.2)

## 2014-05-16 LAB — URINALYSIS, COMPLETE
BLOOD: NEGATIVE
Bacteria: NONE SEEN
Bilirubin,UR: NEGATIVE
Glucose,UR: NEGATIVE mg/dL (ref 0–75)
KETONE: NEGATIVE
Leukocyte Esterase: NEGATIVE
Nitrite: NEGATIVE
PROTEIN: NEGATIVE
Ph: 6 (ref 4.5–8.0)
RBC, UR: NONE SEEN /HPF (ref 0–5)
Specific Gravity: 1.018 (ref 1.003–1.030)
WBC UR: NONE SEEN /HPF (ref 0–5)

## 2014-05-16 LAB — CBC
HCT: 41.3 % (ref 35.0–47.0)
HGB: 13.9 g/dL (ref 12.0–16.0)
MCH: 30.4 pg (ref 26.0–34.0)
MCHC: 33.6 g/dL (ref 32.0–36.0)
MCV: 91 fL (ref 80–100)
Platelet: 196 10*3/uL (ref 150–440)
RBC: 4.56 10*6/uL (ref 3.80–5.20)
RDW: 13.8 % (ref 11.5–14.5)
WBC: 7.5 10*3/uL (ref 3.6–11.0)

## 2014-05-16 LAB — LIPASE, BLOOD: Lipase: 138 U/L (ref 73–393)

## 2014-06-30 ENCOUNTER — Telehealth: Payer: Self-pay | Admitting: Internal Medicine

## 2014-06-30 NOTE — Telephone Encounter (Signed)
Pt called is experiencing heartburn symptoms and would like to discuss options with nurse.

## 2014-08-03 ENCOUNTER — Telehealth: Payer: Self-pay | Admitting: Internal Medicine

## 2014-08-03 NOTE — Telephone Encounter (Signed)
Pt would like to speak to nurse regarding medications. Pt is not sure whether she still needs to take the vitamin D supplement and is asking for change in back pain medication. Please f/u with pt.

## 2014-09-14 ENCOUNTER — Telehealth: Payer: Self-pay | Admitting: Internal Medicine

## 2014-09-14 ENCOUNTER — Other Ambulatory Visit: Payer: Self-pay

## 2014-09-14 ENCOUNTER — Telehealth: Payer: Self-pay

## 2014-09-14 DIAGNOSIS — G44209 Tension-type headache, unspecified, not intractable: Secondary | ICD-10-CM

## 2014-09-14 DIAGNOSIS — M62830 Muscle spasm of back: Secondary | ICD-10-CM

## 2014-09-14 MED ORDER — GABAPENTIN 300 MG PO CAPS: 300.0000 mg | ORAL_CAPSULE | Freq: Every day | ORAL | Status: AC

## 2014-09-14 MED ORDER — AMITRIPTYLINE HCL 150 MG PO TABS: 150.0000 mg | ORAL_TABLET | Freq: Every day | ORAL | Status: AC

## 2014-09-14 NOTE — Telephone Encounter (Signed)
Patient calling requesting a refill on her elavil  And gabapentin Can we refill this -

## 2014-09-14 NOTE — Telephone Encounter (Signed)
Patient called requesting medication refill for amitriptyline (ELAVIL) 150 MG tablet. [facility] [place] ,f/u with pt

## 2014-09-15 ENCOUNTER — Other Ambulatory Visit: Payer: Self-pay | Admitting: Internal Medicine

## 2015-05-17 IMAGING — CR DG LUMBAR SPINE COMPLETE 4+V
5 series · 5 of 5 positions shown · non-contrast
Comparison: None.

CLINICAL DATA: Low back pain for 2 days.

EXAM:
LUMBAR SPINE - COMPLETE 4+ VIEW

[t lumbar spine ap]
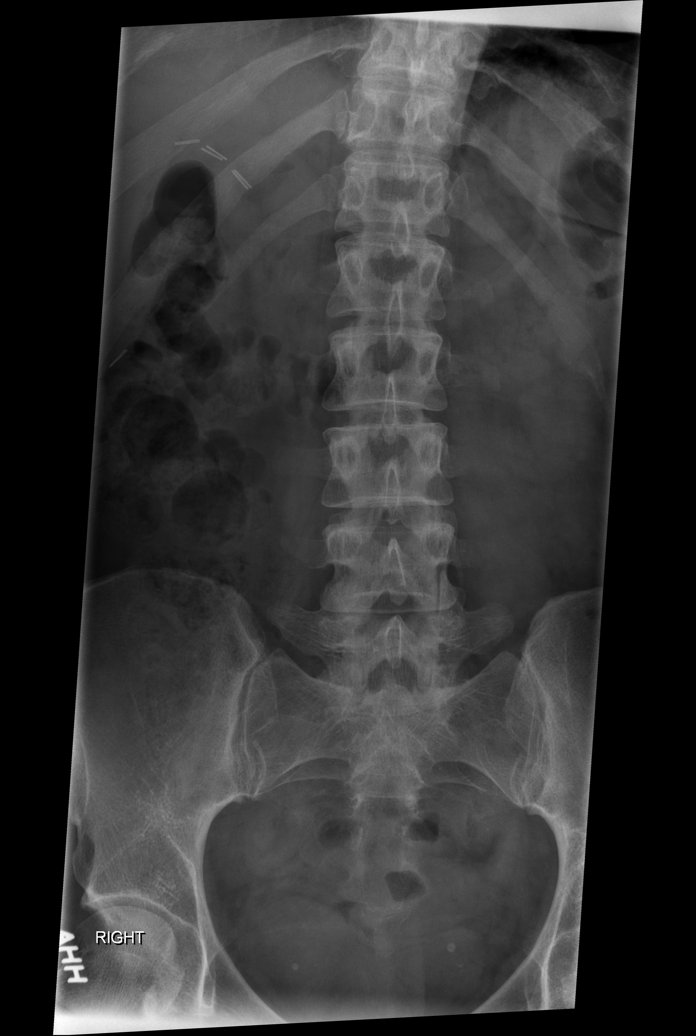

[t lumbar spine obl (1 of 2)]
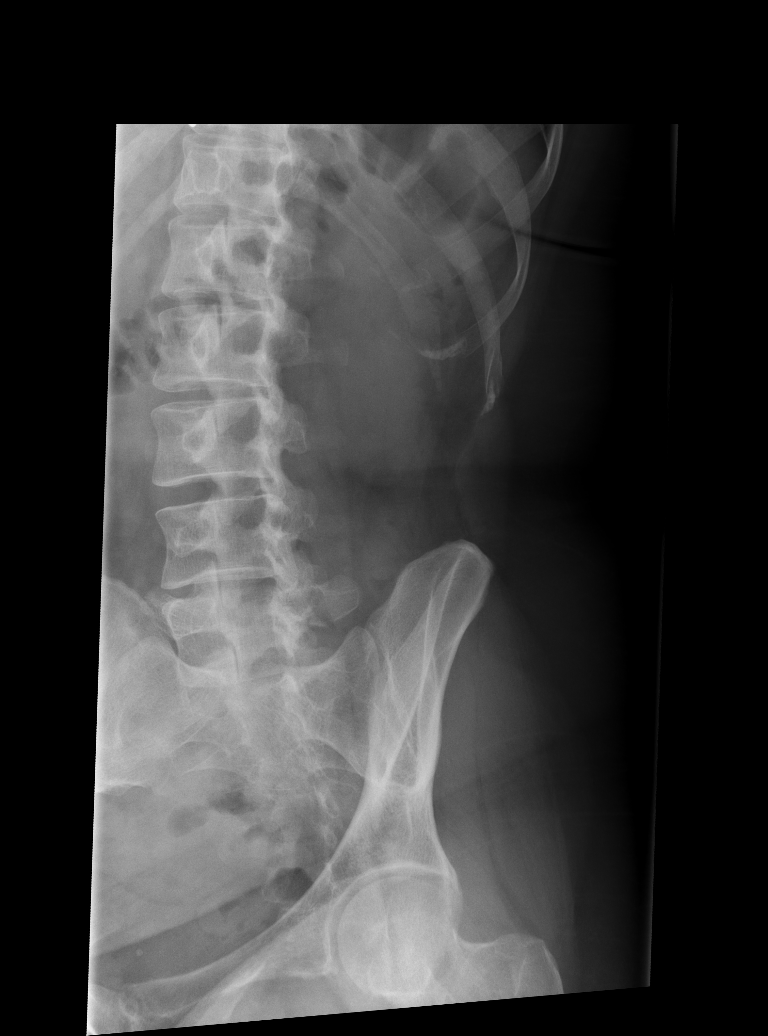

[t lumbar spine obl (2 of 2)]
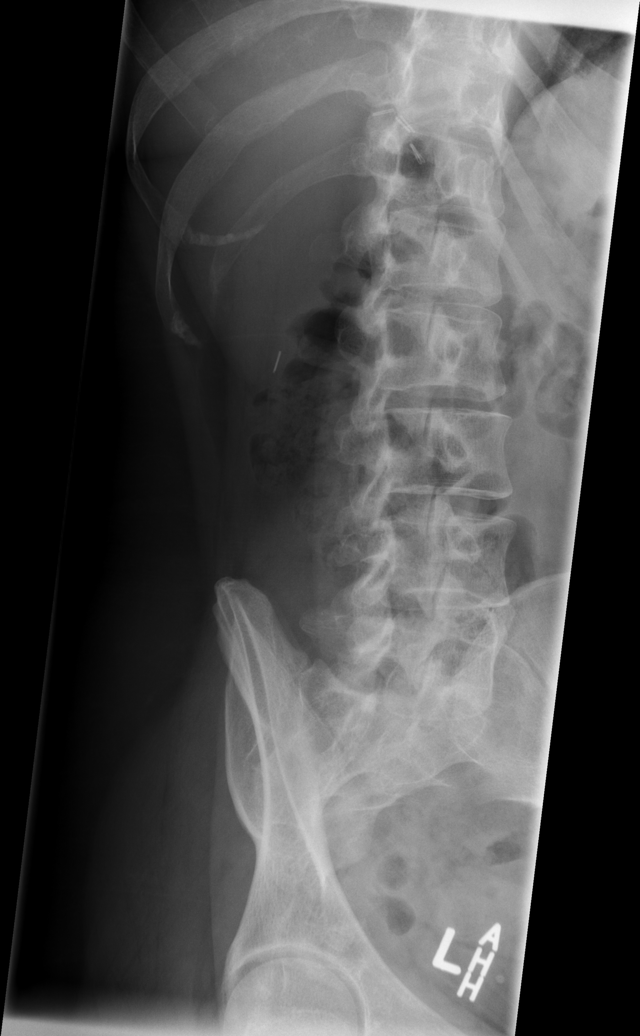

[t lumbar spine lat]
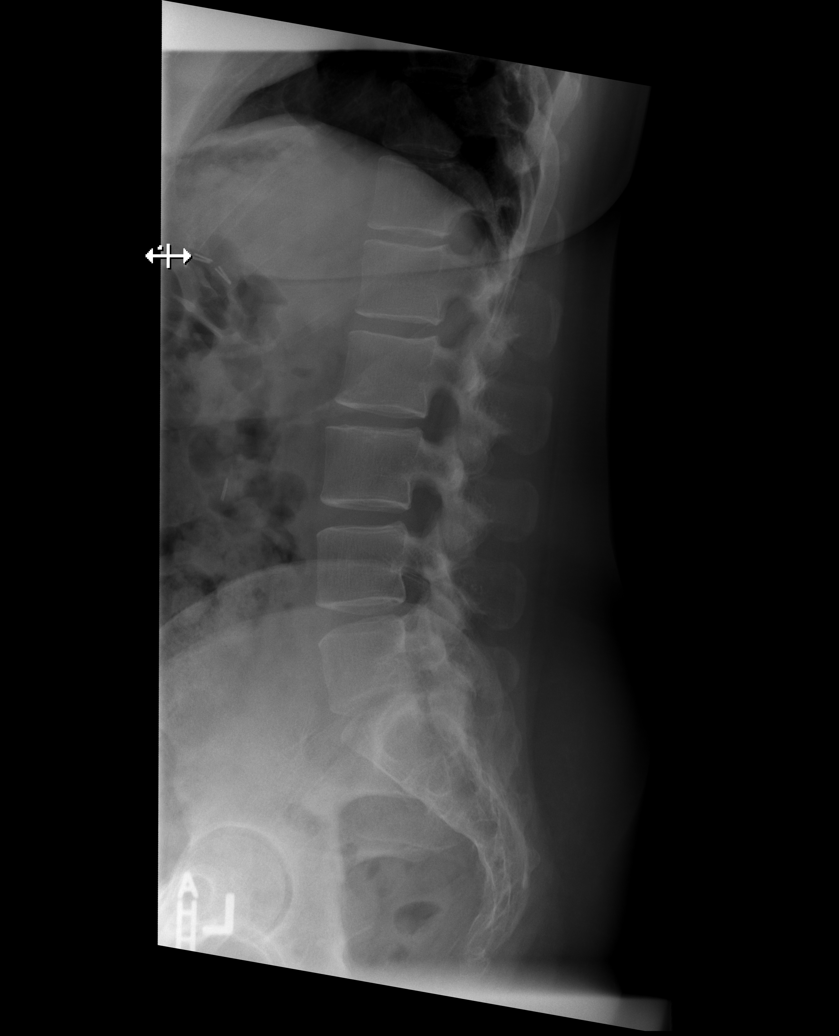

[t lumbar l-5 s-1 spot]
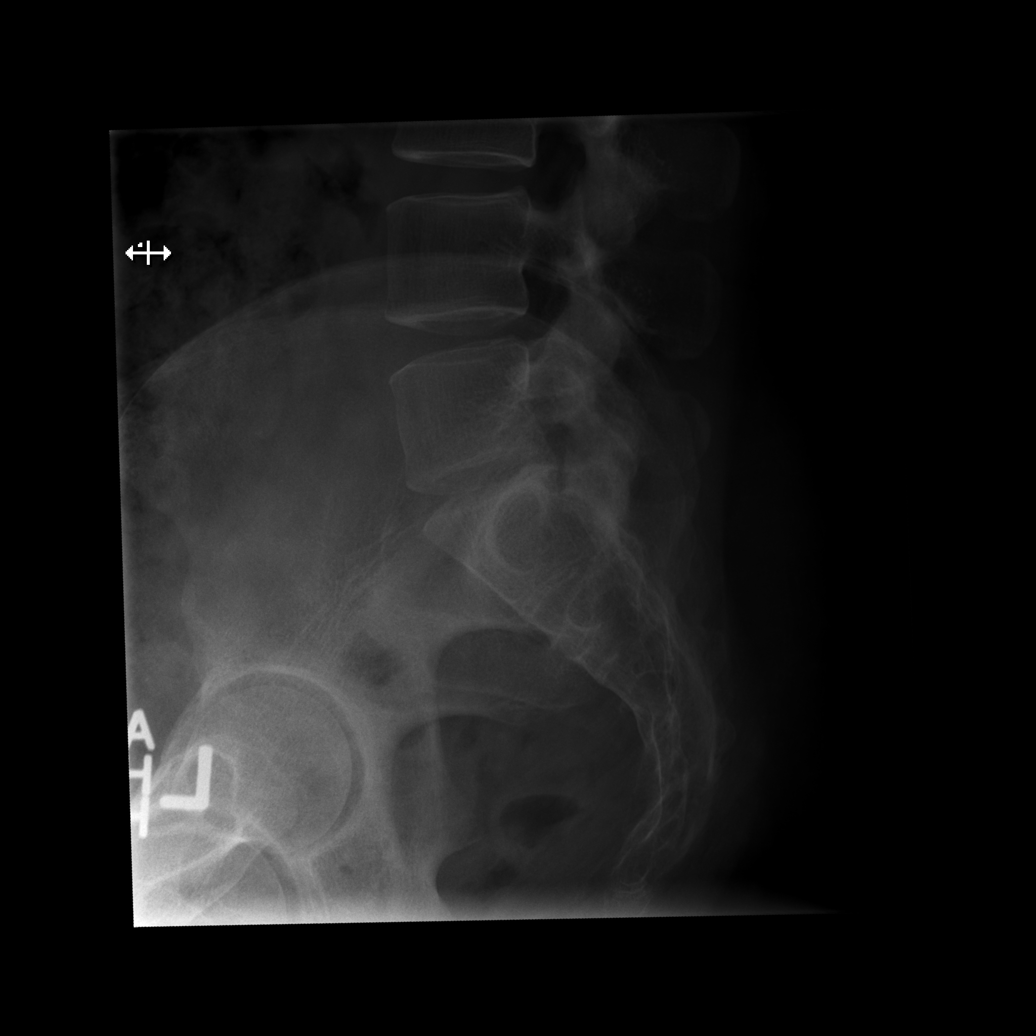

[5 of 5 positions shown; findings below may reference images not displayed]

FINDINGS: There is no fracture or malalignment. Intervertebral disc space
height is maintained. No pars interarticularis defect is identified.
Cholecystectomy clips are noted.
IMPRESSION: Negative exam.

## 2018-06-19 ENCOUNTER — Emergency Department (HOSPITAL_COMMUNITY)
Admission: EM | Admit: 2018-06-19 | Discharge: 2018-06-19 | Disposition: A | Discharge status: Home / Self Care | Payer: Medicaid Other | Attending: Emergency Medicine | Admitting: Emergency Medicine

## 2018-06-19 ENCOUNTER — Encounter (HOSPITAL_COMMUNITY): Payer: Self-pay

## 2018-06-19 DIAGNOSIS — I10 Essential (primary) hypertension: Secondary | ICD-10-CM | POA: Diagnosis not present

## 2018-06-19 DIAGNOSIS — J45909 Unspecified asthma, uncomplicated: Secondary | ICD-10-CM | POA: Insufficient documentation

## 2018-06-19 DIAGNOSIS — Z79899 Other long term (current) drug therapy: Secondary | ICD-10-CM | POA: Diagnosis not present

## 2018-06-19 DIAGNOSIS — J029 Acute pharyngitis, unspecified: Secondary | ICD-10-CM | POA: Diagnosis present

## 2018-06-19 LAB — GROUP A STREP BY PCR: Group A Strep by PCR: NOT DETECTED

## 2018-06-19 LAB — POC URINE PREG, ED: Preg Test, Ur: NEGATIVE

## 2018-06-19 MED ORDER — DEXAMETHASONE 4 MG PO TABS
10.0000 mg | ORAL_TABLET | Freq: Once | ORAL | Status: AC
  Administered 2018-06-19: 10 mg via ORAL
  Filled 2018-06-19: qty 3

## 2018-06-19 MED ORDER — LIDOCAINE VISCOUS HCL 2 % MT SOLN: 15.0000 mL | OROMUCOSAL | 0 refills | Status: AC | PRN

## 2018-06-19 MED ORDER — LIDOCAINE VISCOUS HCL 2 % MT SOLN
15.0000 mL | Freq: Once | OROMUCOSAL | Status: AC
  Administered 2018-06-19: 15 mL via OROMUCOSAL
  Filled 2018-06-19: qty 15

## 2018-06-19 NOTE — ED Triage Notes (Signed)
Pt reports sore throat since this morning. Pt denies fevers/chills and denies N/V/D or cough/SOB.

## 2018-06-19 NOTE — ED Provider Notes (Signed)
[facility] EMERGENCY DEPARTMENT Provider Note   CSN: [id number] Arrival date & time: 06/19/18  1446     History   Chief Complaint Chief Complaint  Patient presents with  . Sore Throat    HPI Nicole Combs is a 38 y.o. female.  HPI 38 year old female with no pertinent past medical history presents to the ED for evaluation of sore throat.  Pain started this morning when she awoke from sleep.  Sick contacts with similar symptoms.  She states that is painful to swallow.  Reports a mild rhinorrhea.  Denies any cough.  No known fevers.  She has not taken anything for symptoms prior to arrival.  Nothing makes better or worse. Past Medical History:  Diagnosis Date  . Anemia   . Asthma   . Bronchitis   . High cholesterol   . Hypertension   . Migraine   . Migraine   . Ovarian cyst     Patient Active Problem List   Diagnosis Date Noted  . Blurry vision 12/25/2013  . Back muscle spasm 12/25/2013  . Headache(784.0) 12/25/2013  . Elevated BP 12/25/2013  . Back pain 06/27/2013    Past Surgical History:  Procedure Laterality Date  . CESAREAN SECTION    . CHOLECYSTECTOMY    . KNEE SURGERY    . TONSILLECTOMY    . TUBAL LIGATION       OB History   No obstetric history on file.      Home Medications    Prior to Admission medications   Medication Sig Start Date End Date Taking? Authorizing Provider  albuterol (PROVENTIL HFA;VENTOLIN HFA) 108 (90 BASE) MCG/ACT inhaler Inhale 2 puffs into the lungs every 6 (six) hours as needed for wheezing or shortness of breath. 09/15/13   Quentin Angst, MD  amitriptyline (ELAVIL) 150 MG tablet Take 1 tablet (150 mg total) by mouth at bedtime. 09/14/14   Doris Cheadle, MD  cyclobenzaprine (FLEXERIL) 10 MG tablet Take 1 tablet (10 mg total) by mouth at bedtime. 03/29/14   Doris Cheadle, MD  fluticasone (FLONASE) 50 MCG/ACT nasal spray Place 2 sprays into both nostrils daily. 03/29/14   Doris Cheadle, MD    gabapentin (NEURONTIN) 300 MG capsule Take 1 capsule (300 mg total) by mouth at bedtime. 09/14/14   Doris Cheadle, MD  medroxyPROGESTERone (DEPO-PROVERA) 150 MG/ML injection Inject 150 mg into the muscle every 3 (three) months.    [provider]  naproxen (NAPROSYN) 500 MG tablet Take 500 mg by mouth 2 (two) times daily with a meal.    [provider]  naproxen (NAPROSYN) 500 MG tablet TAKE 1 TABLET (500 MG TOTAL) BY MOUTH 2 (TWO) TIMES DAILY WITH A MEAL.    Quentin Angst, MD  Olopatadine HCl (PATADAY) 0.2 % SOLN Place 1 drop into both eyes 2 (two) times daily as needed (for allergies).    [provider]  Vitamin D, Ergocalciferol, (DRISDOL) 50000 UNITS CAPS capsule Take 1 capsule (50,000 Units total) by mouth every 7 (seven) days. 03/29/14   Doris Cheadle, MD    Family History History reviewed. No pertinent family history.  Social History Social History   Tobacco Use  . Smoking status: Never Smoker  . Smokeless tobacco: Never Used  Substance Use Topics  . Alcohol use: No  . Drug use: No     Allergies   Codeine   Review of Systems Review of Systems  Constitutional: Negative for chills and fever.  HENT: Positive for  rhinorrhea and sore throat. Negative for congestion.   Eyes: Negative for discharge.  Respiratory: Negative for cough.   Gastrointestinal: Negative for diarrhea, nausea and vomiting.  Musculoskeletal: Negative for myalgias.  Skin: Negative for rash.  Neurological: Negative for headaches.     Physical Exam Updated Vital Signs BP 134/85 (BP Location: Right Arm)   Pulse 100   Temp 97.9 F (36.6 C) (Oral)   Resp 16   Ht 5\' 7"  (1.702 m)   Wt 88 kg   LMP 05/19/2018   SpO2 100%   BMI 30.38 kg/m   Physical Exam Vitals signs and nursing note reviewed.  Constitutional:      General: She is not in acute distress.    Appearance: She is well-developed.  HENT:     Head: Normocephalic and atraumatic.     Right Ear:  Tympanic membrane and ear canal normal. No drainage.     Left Ear: Tympanic membrane and ear canal normal. No drainage.     Nose: Congestion and rhinorrhea present.     Mouth/Throat:     Mouth: Mucous membranes are moist. No oral lesions.     Pharynx: Uvula midline. Posterior oropharyngeal erythema present. No pharyngeal swelling, oropharyngeal exudate or uvula swelling.     Tonsils: No tonsillar exudate or tonsillar abscesses.  Eyes:     General: No scleral icterus.       Right eye: No discharge.        Left eye: No discharge.  Neck:     Musculoskeletal: Normal range of motion and neck supple.     Comments: No nuchal rigidity. Pulmonary:     Effort: No respiratory distress.  Musculoskeletal: Normal range of motion.  Lymphadenopathy:     Cervical: Cervical adenopathy present.  Skin:    General: Skin is warm and dry.     Capillary Refill: Capillary refill takes less than 2 seconds.     Coloration: Skin is not pale.  Neurological:     Mental Status: She is alert.  Psychiatric:        Mood and Affect: Mood normal.        Behavior: Behavior normal.        Thought Content: Thought content normal.        Judgment: Judgment normal.      ED Treatments / Results  Labs (all labs ordered are listed, but only abnormal results are displayed) Labs Reviewed  GROUP A STREP BY PCR  POC URINE PREG, ED    EKG None  Radiology No results found.  Procedures Procedures (including critical care time)  Medications Ordered in ED Medications  dexamethasone (DECADRON) tablet 10 mg (has no administration in time range)  lidocaine (XYLOCAINE) 2 % viscous mouth solution 15 mL (has no administration in time range)     Initial Impression / Assessment and Plan / ED Course  I have reviewed the triage vital signs and the nursing notes.  Pertinent labs & imaging results that were available during my care of the patient were reviewed by me and considered in my medical decision making (see  chart for details).     Pt afebrile without tonsillar exudate, negative strep. Presents with mild cervical lymphadenopathy, & dysphagia; diagnosis of viral pharyngitis. No abx indicated. DC w symptomatic tx for pain  Pt does not appear dehydrated, but did discuss importance of water rehydration. Presentation non concerning for PTA or infxn spread to soft tissue. No trismus or uvula deviation. Specific return precautions discussed. Pt able  to drink water in ED without difficulty with intact air way. Recommended PCP follow up.  Pt is hemodynamically stable, in NAD, & able to ambulate in the ED. Evaluation does not show pathology that would require ongoing emergent intervention or inpatient treatment. I explained the diagnosis to the patient. Pain has been managed & has no complaints prior to dc. Pt is comfortable with above plan and is stable for discharge at this time. All questions were answered prior to disposition. Strict return precautions for f/u to the ED were discussed. Encouraged follow up with PCP.     Final Clinical Impressions(s) / ED Diagnoses   Final diagnoses:  Sore throat    ED Discharge Orders         Ordered    lidocaine (XYLOCAINE) 2 % solution  As needed     06/19/18 1600           Wallace KellerLeaphart,  T, PA-C 06/19/18 1602    Tegeler, Canary Brimhristopher J, MD 06/19/18 [phone]

## 2018-06-19 NOTE — ED Notes (Signed)
Patient verbalizes understanding of discharge instructions. Opportunity for questioning and answers were provided. Armband removed by staff, pt discharged from ED. Pt ambulatory to lobby.  

## 2018-06-19 NOTE — Discharge Instructions (Signed)
Strep test was negative.  This is likely a viral illness.  Have given you viscous lidocaine to help with the sore throat.  Motrin and Tylenol at home for the pain.  Follow-up with your primary care doctor.  Return to ED with worsening symptoms.

## 2018-07-21 ENCOUNTER — Ambulatory Visit: Payer: Self-pay | Admitting: Family Medicine

## 2018-09-03 ENCOUNTER — Ambulatory Visit: Payer: Medicaid Other | Admitting: Family Medicine

## 2018-10-08 ENCOUNTER — Ambulatory Visit: Payer: Medicaid Other | Attending: Family Medicine | Admitting: Family Medicine

## 2018-10-08 ENCOUNTER — Other Ambulatory Visit: Payer: Self-pay
